# Patient Record
Sex: Male | Born: 1985 | Race: White | Hispanic: No | Marital: Married | Smoking: Current some day smoker
Health system: Southern US, Community
[De-identification: ages and names within clinical notes are randomized; demographics above are authoritative.]

## PROBLEM LIST (undated history)

## (undated) ENCOUNTER — Emergency Department (HOSPITAL_COMMUNITY): Admission: EM | Payer: Self-pay | Source: Home / Self Care

## (undated) DIAGNOSIS — J45909 Unspecified asthma, uncomplicated: Secondary | ICD-10-CM

## (undated) DIAGNOSIS — K219 Gastro-esophageal reflux disease without esophagitis: Secondary | ICD-10-CM

## (undated) HISTORY — PX: HERNIA REPAIR: SHX51

---

## 2019-05-04 ENCOUNTER — Encounter: Payer: Self-pay | Admitting: Emergency Medicine

## 2019-05-04 ENCOUNTER — Ambulatory Visit
Admission: EM | Admit: 2019-05-04 | Discharge: 2019-05-04 | Disposition: A | Discharge status: Home / Self Care | Payer: Self-pay

## 2019-05-04 DIAGNOSIS — J0100 Acute maxillary sinusitis, unspecified: Secondary | ICD-10-CM

## 2019-05-04 HISTORY — DX: Unspecified asthma, uncomplicated: J45.909

## 2019-05-04 HISTORY — DX: Gastro-esophageal reflux disease without esophagitis: K21.9

## 2019-05-04 MED ORDER — AMOXICILLIN-POT CLAVULANATE 875-125 MG PO TABS: 1.0000 | ORAL_TABLET | Freq: Two times a day (BID) | ORAL | 0 refills | Status: AC

## 2019-05-04 NOTE — ED Triage Notes (Signed)
Pt presents with c/o sinus congestion and sinus pressure for past 2 weeks

## 2019-05-04 NOTE — ED Provider Notes (Signed)
[facility]    CSN: [id number] Arrival date & time: 05/04/19  1757      History   Chief Complaint Chief Complaint  Patient presents with  . Nasal Congestion    HPI Eddie Hart is a 34 y.o. male.   Who presented to the urgent care with a complaint of worsening sinus congestion, and sinus pressure for the past 2 weeks.  Report yellow-greenish mucus.  Denies sick exposure to COVID, flu or strep.  Denies recent travel.  Denies aggravating or alleviating symptoms.  Has tried Sudafed, Zyrtec, and Flonase without relief.  Denies previous COVID infection.   Denies fever, chills, fatigue, nasal congestion, rhinorrhea, sore throat, cough, SOB, wheezing, chest pain, nausea, vomiting, changes in bowel or bladder habits.        Past Medical History:  Diagnosis Date  . Asthma   . GERD (gastroesophageal reflux disease)     There are no problems to display for this patient.   Past Surgical History:  Procedure Laterality Date  . HERNIA REPAIR         Home Medications    Prior to Admission medications   Medication Sig Start Date End Date Taking? Authorizing Provider  omeprazole (PRILOSEC) 20 MG capsule Take 20 mg by mouth in the morning and at bedtime.   Yes [provider]  amoxicillin-clavulanate (AUGMENTIN) 875-125 MG tablet Take 1 tablet by mouth every 12 (twelve) hours. 05/04/19   Gregor Dershem, Zachery Dakins, FNP    Family History History reviewed. No pertinent family history.  Social History Social History   Tobacco Use  . Smoking status: Current Some Day Smoker    Types: Cigarettes  . Smokeless tobacco: Never Used  Substance Use Topics  . Alcohol use: Yes  . Drug use: Not Currently     Allergies   Patient has no known allergies.   Review of Systems Review of Systems  Constitutional: Negative.   HENT: Positive for sinus pressure and sinus pain.   Respiratory: Negative.   Cardiovascular: Negative.   All other systems reviewed and are  negative.    Physical Exam Triage Vital Signs ED Triage Vitals  Enc Vitals Group     BP      Pulse      Resp      Temp      Temp src      SpO2      Weight      Height      Head Circumference      Peak Flow      Pain Score      Pain Loc      Pain Edu?      Excl. in GC?    No data found.  Updated Vital Signs BP (!) 141/84   Pulse 81   Temp 98.4 F (36.9 C)   Resp 18   SpO2 98%   Visual Acuity Right Eye Distance:   Left Eye Distance:   Bilateral Distance:    Right Eye Near:   Left Eye Near:    Bilateral Near:     Physical Exam Vitals and nursing note reviewed.  Constitutional:      General: He is not in acute distress.    Appearance: Normal appearance. He is normal weight. He is not ill-appearing or toxic-appearing.  HENT:     Head: Normocephalic.     Right Ear: Tympanic membrane, ear canal and external ear normal. There is no impacted cerumen.     Left  Ear: Tympanic membrane, ear canal and external ear normal. There is no impacted cerumen.     Nose: No congestion or rhinorrhea.     Right Sinus: Maxillary sinus tenderness present.     Left Sinus: Maxillary sinus tenderness present.     Mouth/Throat:     Mouth: Mucous membranes are moist.     Pharynx: Oropharynx is clear. No oropharyngeal exudate or posterior oropharyngeal erythema.  Cardiovascular:     Rate and Rhythm: Normal rate and regular rhythm.     Pulses: Normal pulses.     Heart sounds: Normal heart sounds. No murmur.  Pulmonary:     Effort: Pulmonary effort is normal. No respiratory distress.     Breath sounds: Normal breath sounds. No wheezing or rhonchi.  Chest:     Chest wall: No tenderness.  Neurological:     Mental Status: He is alert and oriented to person, place, and time.      UC Treatments / Results  Labs (all labs ordered are listed, but only abnormal results are displayed) Labs Reviewed - No data to display  EKG   Radiology No results found.  Procedures Procedures  (including critical care time)  Medications Ordered in UC Medications - No data to display  Initial Impression / Assessment and Plan / UC Course  I have reviewed the triage vital signs and the nursing notes.  Pertinent labs & imaging results that were available during my care of the patient were reviewed by me and considered in my medical decision making (see chart for details).     Patient is stable at discharge. Symptoms consistent with acute sinusitis Augmentin was prescribed Patient was advised to continue to take Flonase and Zyrtec as prescribed Return  for worsening of symptoms   Final Clinical Impressions(s) / UC Diagnoses   Final diagnoses:  Acute maxillary sinusitis, recurrence not specified     Discharge Instructions     Rest and push fluids Continue to take Zyrtec as prescribed  Continue to take Flonase as prescribed Augmentin prescribed.  Take as directed and to completion Continue with OTC ibuprofen/tylenol as needed for pain Follow up with PCP or [facility] if symptoms persists Return or go to the ED if you have any new or worsening symptoms such as fever, chills, worsening sinus pain/pressure, cough, sore throat, chest pain, shortness of breath, abdominal pain, changes in bowel or bladder habits, etc...     ED Prescriptions    Medication Sig Dispense Auth. Provider   amoxicillin-clavulanate (AUGMENTIN) 875-125 MG tablet Take 1 tablet by mouth every 12 (twelve) hours. 14 tablet Brayton Baumgartner, Darrelyn Hillock, FNP     PDMP not reviewed this encounter.   Emerson Monte, FNP 05/04/19 1826

## 2019-05-04 NOTE — Discharge Instructions (Addendum)
Rest and push fluids Continue to take Zyrtec as prescribed  Continue to take Flonase as prescribed Augmentin prescribed.  Take as directed and to completion Continue with OTC ibuprofen/tylenol as needed for pain Follow up with PCP or [facility] if symptoms persists Return or go to the ED if you have any new or worsening symptoms such as fever, chills, worsening sinus pain/pressure, cough, sore throat, chest pain, shortness of breath, abdominal pain, changes in bowel or bladder habits, etc..Marland Kitchen

## 2019-05-07 ENCOUNTER — Emergency Department (HOSPITAL_COMMUNITY)
Admission: EM | Admit: 2019-05-07 | Discharge: 2019-05-07 | Disposition: A | Discharge status: Home / Self Care | Payer: Self-pay | Attending: Emergency Medicine | Admitting: Emergency Medicine

## 2019-05-07 ENCOUNTER — Encounter (HOSPITAL_COMMUNITY): Payer: Self-pay | Admitting: Emergency Medicine

## 2019-05-07 ENCOUNTER — Other Ambulatory Visit: Payer: Self-pay

## 2019-05-07 DIAGNOSIS — J45909 Unspecified asthma, uncomplicated: Secondary | ICD-10-CM | POA: Insufficient documentation

## 2019-05-07 DIAGNOSIS — J0100 Acute maxillary sinusitis, unspecified: Secondary | ICD-10-CM | POA: Insufficient documentation

## 2019-05-07 DIAGNOSIS — F17210 Nicotine dependence, cigarettes, uncomplicated: Secondary | ICD-10-CM | POA: Insufficient documentation

## 2019-05-07 MED ORDER — NAPROXEN 500 MG PO TABS: 500.0000 mg | ORAL_TABLET | Freq: Two times a day (BID) | ORAL | 0 refills | Status: AC

## 2019-05-07 MED ORDER — PREDNISONE 10 MG PO TABS: ORAL_TABLET | ORAL | 0 refills | Status: AC

## 2019-05-07 NOTE — ED Provider Notes (Signed)
[facility] EMERGENCY DEPARTMENT Provider Note   CSN: [id number] Arrival date & time: 05/07/19  1340     History Chief Complaint  Patient presents with  . Facial Pain    Eddie Hart is a 34 y.o. male.  HPI      Eddie Hart is a 33 y.o. male who presents to the Emergency Department complaining of  Right sided facial pain and sinus pressure for greater than two weeks.  He was trying to manage his symptoms at home using a Nettie pot, Flonase nasal spray and Sudafed.  He states his facial pain and pressure has persisted.  He describes a throbbing pain from his right cheek that radiates behind his eye and into his right temple.  Pain temporarily improves with pressure along his right cheek.  He also endorses some nasal congestion.  He was seen 3 days ago at a local urgent care and prescribed Augmentin.  He comes here due to no improvement with his current treatment.  He denies any worsening symptoms, headache, facial weakness or numbness, dizziness, fever or chills.  He also complains of some pain along his right jaw, but states this is minimal.  He denies any dental pain or caries.  No neck pain or stiffness.    Past Medical History:  Diagnosis Date  . Asthma   . GERD (gastroesophageal reflux disease)     There are no problems to display for this patient.   Past Surgical History:  Procedure Laterality Date  . HERNIA REPAIR       No family history on file.  Social History   Tobacco Use  . Smoking status: Current Some Day Smoker    Types: Cigarettes  . Smokeless tobacco: Never Used  Substance Use Topics  . Alcohol use: Yes  . Drug use: Not Currently    Home Medications Prior to Admission medications   Medication Sig Start Date End Date Taking? Authorizing Provider  acetaminophen (TYLENOL) 500 MG tablet Take 1,500 mg by mouth every 6 (six) hours as needed for mild pain, fever or headache.   Yes [provider]  albuterol (VENTOLIN HFA) 108 (90 Base)  MCG/ACT inhaler Inhale 1-2 puffs into the lungs every 6 (six) hours as needed for wheezing or shortness of breath.   Yes [provider]  amoxicillin-clavulanate (AUGMENTIN) 875-125 MG tablet Take 1 tablet by mouth every 12 (twelve) hours. 05/04/19  Yes Avegno, Zachery Dakins, FNP  cetirizine (ZYRTEC) 10 MG tablet Take 10 mg by mouth daily.   Yes [provider]  cholecalciferol (VITAMIN D3) 25 MCG (1000 UNIT) tablet Take 5,000 Units by mouth daily.   Yes [provider]  fluticasone (FLONASE) 50 MCG/ACT nasal spray Place 2 sprays into both nostrils in the morning and at bedtime.   Yes [provider]  naproxen sodium (ALEVE) 220 MG tablet Take 440 mg by mouth daily as needed.   Yes [provider]  omeprazole (PRILOSEC) 20 MG capsule Take 20 mg by mouth in the morning and at bedtime.   Yes [provider]  pseudoephedrine (SUDAFED) 120 MG 12 hr tablet Take 120 mg by mouth every 12 (twelve) hours as needed for congestion.   Yes [provider]    Allergies    Patient has no known allergies.  Review of Systems   Review of Systems  Constitutional: Negative for activity change, appetite change, chills and fever.  HENT: Positive for congestion, sinus pressure and sinus pain. Negative for facial swelling, nosebleeds, rhinorrhea,  sore throat and trouble swallowing.   Eyes: Negative for pain and visual disturbance.  Respiratory: Negative for cough, shortness of breath and wheezing.   Gastrointestinal: Negative for nausea and vomiting.  Musculoskeletal: Negative for neck pain and neck stiffness.  Skin: Negative for rash.  Neurological: Negative for dizziness, weakness, numbness and headaches.  Hematological: Negative for adenopathy.  Psychiatric/Behavioral: Negative for confusion.    Physical Exam Updated Vital Signs BP (!) 155/91 (BP Location: Right Arm)   Pulse 84   Temp 98.2 F (36.8 C) (Oral)   Resp 16   Ht 5\' 10"  (1.778 m)   Wt  98 kg   SpO2 100%   BMI 30.99 kg/m   Physical Exam Vitals and nursing note reviewed.  Constitutional:      General: He is not in acute distress.    Appearance: Normal appearance. He is not ill-appearing or toxic-appearing.  HENT:     Right Ear: Tympanic membrane and ear canal normal.     Left Ear: Tympanic membrane and ear canal normal.     Nose: Mucosal edema present.     Right Sinus: Maxillary sinus tenderness and frontal sinus tenderness present.     Left Sinus: No maxillary sinus tenderness or frontal sinus tenderness.     Mouth/Throat:     Mouth: Mucous membranes are moist. No oral lesions.     Dentition: No dental tenderness or dental caries.     Pharynx: Oropharynx is clear. Uvula midline. No uvula swelling.  Eyes:     Extraocular Movements: Extraocular movements intact.     Conjunctiva/sclera: Conjunctivae normal.  Cardiovascular:     Rate and Rhythm: Normal rate and regular rhythm.     Pulses: Normal pulses.  Pulmonary:     Effort: Pulmonary effort is normal. No respiratory distress.     Breath sounds: No wheezing.  Abdominal:     General: There is no distension.     Palpations: Abdomen is soft.     Tenderness: There is no abdominal tenderness.  Musculoskeletal:        General: Normal range of motion.     Right lower leg: No edema.     Left lower leg: No edema.  Skin:    General: Skin is warm.     Capillary Refill: Capillary refill takes less than 2 seconds.     Findings: No erythema or rash.  Neurological:     General: No focal deficit present.     Mental Status: He is alert.     Sensory: Sensation is intact. No sensory deficit.     Motor: No weakness.     Comments: CN II-XII grossly intact.  No facial droop or dysarthria     ED Results / Procedures / Treatments   Labs (all labs ordered are listed, but only abnormal results are displayed) Labs Reviewed - No data to display  EKG None  Radiology No results found.  Procedures Procedures (including  critical care time)  Medications Ordered in ED Medications - No data to display  ED Course  I have reviewed the triage vital signs and the nursing notes.  Pertinent labs & imaging results that were available during my care of the patient were reviewed by me and considered in my medical decision making (see chart for details).    MDM Rules/Calculators/A&P                      Patient with 2-week history of nasal congestion, sinus pressure  and pain.  He was seen earlier this week at urgent care and prescribed Augmentin.  No focal neurological deficits, no dental injury or tenderness.  Patient well-appearing.  Symptoms are likely related to persistent sinusitis.  Will prescribe steroids and he agrees to outpatient follow-up if needed.  Return precautions were discussed.   Final Clinical Impression(s) / ED Diagnoses Final diagnoses:  Acute maxillary sinusitis, recurrence not specified    Rx / DC Orders ED Discharge Orders    None       Pauline Aus, PA-C 05/07/19 1521    Bethann Berkshire, MD 05/10/19 1034

## 2019-05-07 NOTE — ED Triage Notes (Signed)
Patient complains of sinus congestion for 2.5 weeks. Was seen at [facility] 3 days ago and given Augmentin. Patient states that he feels like its getting worse and not getting better.

## 2019-05-07 NOTE — Discharge Instructions (Addendum)
Continue taking your Augmentin as directed until its finished.  Also continue taking your Sudafed and using your Flonase nasal spray.  Start the prednisone prescription this evening or tomorrow.  Return to the emergency department for any worsening symptoms

## 2019-05-11 ENCOUNTER — Encounter (HOSPITAL_COMMUNITY): Payer: Self-pay | Admitting: Emergency Medicine

## 2019-05-11 ENCOUNTER — Emergency Department (HOSPITAL_COMMUNITY)
Admission: EM | Admit: 2019-05-11 | Discharge: 2019-05-11 | Disposition: A | Discharge status: Home / Self Care | Payer: Self-pay | Attending: Emergency Medicine | Admitting: Emergency Medicine

## 2019-05-11 ENCOUNTER — Other Ambulatory Visit: Payer: Self-pay

## 2019-05-11 DIAGNOSIS — J320 Chronic maxillary sinusitis: Secondary | ICD-10-CM | POA: Insufficient documentation

## 2019-05-11 DIAGNOSIS — J45909 Unspecified asthma, uncomplicated: Secondary | ICD-10-CM | POA: Insufficient documentation

## 2019-05-11 DIAGNOSIS — Z79899 Other long term (current) drug therapy: Secondary | ICD-10-CM | POA: Insufficient documentation

## 2019-05-11 DIAGNOSIS — F17210 Nicotine dependence, cigarettes, uncomplicated: Secondary | ICD-10-CM | POA: Insufficient documentation

## 2019-05-11 MED ORDER — DOXYCYCLINE HYCLATE 100 MG PO TABS
100.0000 mg | ORAL_TABLET | Freq: Once | ORAL | Status: AC
  Administered 2019-05-11: 100 mg via ORAL
  Filled 2019-05-11: qty 1

## 2019-05-11 MED ORDER — HYDROCODONE-ACETAMINOPHEN 5-325 MG PO TABS: 1.0000 | ORAL_TABLET | ORAL | 0 refills | Status: AC | PRN

## 2019-05-11 MED ORDER — DOXYCYCLINE HYCLATE 100 MG PO TABS: 100.0000 mg | ORAL_TABLET | Freq: Two times a day (BID) | ORAL | 0 refills | Status: DC

## 2019-05-11 NOTE — ED Triage Notes (Signed)
Patient was seen here over the weekend and prescribed antibiotics for a URI. Patient feels like the pain and pressure under his right eye up into his head is getting worse.

## 2019-05-11 NOTE — Discharge Instructions (Signed)
Return if any problems.

## 2019-05-12 MED FILL — Hydrocodone-Acetaminophen Tab 5-325 MG: ORAL | Qty: 6 | Status: AC

## 2019-05-12 NOTE — ED Provider Notes (Signed)
[facility] EMERGENCY DEPARTMENT Provider Note   CSN: [id number] Arrival date & time: 05/11/19  1901     History Chief Complaint  Patient presents with  . sinus pressure    Eddie Hart is a 34 y.o. male.  The history is provided by the patient. No language interpreter was used.  URI Presenting symptoms: congestion, cough and facial pain   Severity:  Moderate Onset quality:  Gradual Timing:  Constant Progression:  Worsening Chronicity:  New Relieved by:  Nothing Worsened by:  Nothing Ineffective treatments:  None tried Associated symptoms: sinus pain   Risk factors: recent illness    Pt has finished a course of Augmentin and a course of prednisone.  Pt has continued right face pain and swelling     Past Medical History:  Diagnosis Date  . Asthma   . GERD (gastroesophageal reflux disease)     There are no problems to display for this patient.   Past Surgical History:  Procedure Laterality Date  . HERNIA REPAIR         No family history on file.  Social History   Tobacco Use  . Smoking status: Current Some Day Smoker    Types: Cigarettes  . Smokeless tobacco: Never Used  Substance Use Topics  . Alcohol use: Yes  . Drug use: Not Currently    Home Medications Prior to Admission medications   Medication Sig Start Date End Date Taking? Authorizing Provider  acetaminophen (TYLENOL) 500 MG tablet Take 1,500 mg by mouth every 6 (six) hours as needed for mild pain, fever or headache.    [provider]  albuterol (VENTOLIN HFA) 108 (90 Base) MCG/ACT inhaler Inhale 1-2 puffs into the lungs every 6 (six) hours as needed for wheezing or shortness of breath.    [provider]  amoxicillin-clavulanate (AUGMENTIN) 875-125 MG tablet Take 1 tablet by mouth every 12 (twelve) hours. 05/04/19   Avegno, Zachery Dakins, FNP  cetirizine (ZYRTEC) 10 MG tablet Take 10 mg by mouth daily.    [provider]  cholecalciferol (VITAMIN D3) 25 MCG (1000  UNIT) tablet Take 5,000 Units by mouth daily.    [provider]  doxycycline (VIBRA-TABS) 100 MG tablet Take 1 tablet (100 mg total) by mouth 2 (two) times daily. 05/11/19   Elson Areas, PA-C  fluticasone (FLONASE) 50 MCG/ACT nasal spray Place 2 sprays into both nostrils in the morning and at bedtime.    [provider]  HYDROcodone-acetaminophen (NORCO) 5-325 MG tablet Take 1 tablet by mouth every 4 (four) hours as needed for moderate pain. 05/11/19   Elson Areas, PA-C  HYDROcodone-acetaminophen (NORCO/VICODIN) 5-325 MG tablet Take 1 tablet by mouth every 4 (four) hours as needed. 05/11/19   Elson Areas, PA-C  naproxen (NAPROSYN) 500 MG tablet Take 1 tablet (500 mg total) by mouth 2 (two) times daily with a meal. 05/07/19   Triplett, Tammy, PA-C  naproxen sodium (ALEVE) 220 MG tablet Take 440 mg by mouth daily as needed.    [provider]  omeprazole (PRILOSEC) 20 MG capsule Take 20 mg by mouth in the morning and at bedtime.    [provider]  predniSONE (DELTASONE) 10 MG tablet Take 6 tablets day one, 5 tablets day two, 4 tablets day three, 3 tablets day four, 2 tablets day five, then 1 tablet day six 05/07/19   Triplett, Tammy, PA-C  pseudoephedrine (SUDAFED) 120 MG 12 hr tablet Take 120 mg by mouth every 12 (twelve) hours  as needed for congestion.    [provider]    Allergies    Patient has no known allergies.  Review of Systems   Review of Systems  HENT: Positive for congestion and sinus pain.   Respiratory: Positive for cough.   All other systems reviewed and are negative.   Physical Exam Updated Vital Signs BP (!) 141/99 (BP Location: Right Arm)   Pulse 72   Temp 98.8 F (37.1 C) (Oral)   Resp 18   Ht 5\' 10"  (1.778 m)   Wt 98 kg   SpO2 98%   BMI 30.99 kg/m   Physical Exam Vitals and nursing note reviewed.  Constitutional:      Appearance: He is well-developed.  HENT:     Head: Normocephalic and atraumatic.      Comments: Tender maxillary sinus on right    Right Ear: Tympanic membrane normal.     Left Ear: Tympanic membrane normal.     Nose: Nose normal.     Mouth/Throat:     Mouth: Mucous membranes are moist.  Eyes:     Conjunctiva/sclera: Conjunctivae normal.  Cardiovascular:     Rate and Rhythm: Normal rate and regular rhythm.     Heart sounds: No murmur.  Pulmonary:     Effort: Pulmonary effort is normal. No respiratory distress.     Breath sounds: Normal breath sounds.  Abdominal:     General: Abdomen is flat.     Palpations: Abdomen is soft.     Tenderness: There is no abdominal tenderness.  Musculoskeletal:        General: Normal range of motion.     Cervical back: Normal range of motion and neck supple.  Skin:    General: Skin is warm and dry.  Neurological:     General: No focal deficit present.     Mental Status: He is alert.     ED Results / Procedures / Treatments   Labs (all labs ordered are listed, but only abnormal results are displayed) Labs Reviewed - No data to display  EKG None  Radiology No results found.  Procedures Procedures (including critical care time)  Medications Ordered in ED Medications  doxycycline (VIBRA-TABS) tablet 100 mg (100 mg Oral Given 05/11/19 2105)    ED Course  I have reviewed the triage vital signs and the nursing notes.  Pertinent labs & imaging results that were available during my care of the patient were reviewed by me and considered in my medical decision making (see chart for details).    MDM Rules/Calculators/A&P                      MDM  Pt given 1st dosage of doxycycline here and prepack of hydrocodone  Final Clinical Impression(s) / ED Diagnoses Final diagnoses:  Maxillary sinusitis, unspecified chronicity    Rx / DC Orders ED Discharge Orders         Ordered    doxycycline (VIBRA-TABS) 100 MG tablet  2 times daily     05/11/19 2021    HYDROcodone-acetaminophen (NORCO/VICODIN) 5-325 MG tablet  Every 4  hours PRN     05/11/19 2021    HYDROcodone-acetaminophen (NORCO) 5-325 MG tablet  Every 4 hours PRN     05/11/19 2055           Sidney Ace 05/12/19 1702    Milton Ferguson, MD 05/16/19 [phone]

## 2019-05-13 ENCOUNTER — Emergency Department (HOSPITAL_COMMUNITY)
Admission: EM | Admit: 2019-05-13 | Discharge: 2019-05-13 | Disposition: A | Discharge status: Home / Self Care | Payer: HRSA Program | Attending: Emergency Medicine | Admitting: Emergency Medicine

## 2019-05-13 ENCOUNTER — Encounter (HOSPITAL_COMMUNITY): Payer: Self-pay | Admitting: Emergency Medicine

## 2019-05-13 ENCOUNTER — Other Ambulatory Visit: Payer: Self-pay

## 2019-05-13 ENCOUNTER — Emergency Department (HOSPITAL_COMMUNITY): Payer: HRSA Program

## 2019-05-13 DIAGNOSIS — R519 Headache, unspecified: Secondary | ICD-10-CM | POA: Diagnosis present

## 2019-05-13 DIAGNOSIS — J0100 Acute maxillary sinusitis, unspecified: Secondary | ICD-10-CM | POA: Insufficient documentation

## 2019-05-13 DIAGNOSIS — U071 COVID-19: Secondary | ICD-10-CM | POA: Diagnosis not present

## 2019-05-13 DIAGNOSIS — Z79899 Other long term (current) drug therapy: Secondary | ICD-10-CM | POA: Diagnosis not present

## 2019-05-13 DIAGNOSIS — F17210 Nicotine dependence, cigarettes, uncomplicated: Secondary | ICD-10-CM | POA: Diagnosis not present

## 2019-05-13 DIAGNOSIS — J45909 Unspecified asthma, uncomplicated: Secondary | ICD-10-CM | POA: Insufficient documentation

## 2019-05-13 LAB — BASIC METABOLIC PANEL
Anion gap: 9 (ref 5–15)
BUN: 14 mg/dL (ref 6–20)
CO2: 26 mmol/L (ref 22–32)
Calcium: 9.3 mg/dL (ref 8.9–10.3)
Chloride: 102 mmol/L (ref 98–111)
Creatinine, Ser: 0.95 mg/dL (ref 0.61–1.24)
GFR calc Af Amer: >60 mL/min (ref >60)
GFR calc non Af Amer: >60 mL/min (ref >60)
Glucose, Bld: 145 mg/dL — ABNORMAL HIGH (ref 70–99)
Potassium: 3.4 mmol/L — ABNORMAL LOW (ref 3.5–5.1)
Sodium: 137 mmol/L (ref 135–145)

## 2019-05-13 LAB — CBC WITH DIFFERENTIAL/PLATELET
Abs Immature Granulocytes: 0.03 10*3/uL (ref 0.00–0.07)
Basophils Absolute: 0.1 10*3/uL (ref 0.0–0.1)
Basophils Relative: 1 %
Eosinophils Absolute: 0.2 10*3/uL (ref 0.0–0.5)
Eosinophils Relative: 2 %
HCT: 40.3 % (ref 39.0–52.0)
Hemoglobin: 13.8 g/dL (ref 13.0–17.0)
Immature Granulocytes: 0 %
Lymphocytes Relative: 12 %
Lymphs Abs: 1.3 10*3/uL (ref 0.7–4.0)
MCH: 31.7 pg (ref 26.0–34.0)
MCHC: 34.2 g/dL (ref 30.0–36.0)
MCV: 92.6 fL (ref 80.0–100.0)
Monocytes Absolute: 0.6 10*3/uL (ref 0.1–1.0)
Monocytes Relative: 6 %
Neutro Abs: 8.2 10*3/uL — ABNORMAL HIGH (ref 1.7–7.7)
Neutrophils Relative %: 79 %
Platelets: 237 10*3/uL (ref 150–400)
RBC: 4.35 MIL/uL (ref 4.22–5.81)
RDW: 12.1 % (ref 11.5–15.5)
WBC: 10.3 10*3/uL (ref 4.0–10.5)
nRBC: 0 % (ref 0.0–0.2)

## 2019-05-13 LAB — RESPIRATORY PANEL BY RT PCR (FLU A&B, COVID)
Influenza A by PCR: NEGATIVE
Influenza B by PCR: NEGATIVE
SARS Coronavirus 2 by RT PCR: POSITIVE — AB

## 2019-05-13 MED ORDER — IOHEXOL 300 MG/ML  SOLN
75.0000 mL | Freq: Once | INTRAMUSCULAR | Status: AC | PRN
  Administered 2019-05-13: 75 mL via INTRAVENOUS

## 2019-05-13 MED ORDER — CLINDAMYCIN HCL 150 MG PO CAPS: 300.0000 mg | ORAL_CAPSULE | Freq: Three times a day (TID) | ORAL | 0 refills | Status: AC

## 2019-05-13 NOTE — ED Provider Notes (Signed)
[facility] EMERGENCY DEPARTMENT Provider Note   CSN: [id number] Arrival date & time: 05/13/19  1708     History Chief Complaint  Patient presents with  . Facial Pain    right    Eddie Hart is a 34 y.o. male who presents for evaluation of persistent right-sided facial pain that has been ongoing for last 4 weeks.  He reports initially, he had some sinus pressure and saw urgent care was prescribed Augmentin.  He came into the ED a few days later for continued pain.  He was given prednisone.  He additionally was seen again on 05/11/19 for evaluation of persistent symptoms after finishing his Augmentin.  He was started on doxycycline which he states he has been compliant with.  He comes back in today for continued pain.  He feels like it is progressively getting worse.  He states that it is in the right face and goes up to the right temple region and feels like he has pain behind his right eye.  He states that he has trouble keeping his eye open secondary to pain.  He has not had any vision changes.  He has not noticed any drainage from the eye.  He does report yesterday, he noticed some purulent drainage coming from his right nare but states that has been a 1 episode it is not occurred again.  He also feels like he has a tingling sensation in his right face but denies any numbness.  He has not had any dental pain but he does feel like it extends onto his right jaw.  He feels like his face has been swollen.  He has not noted any fevers, difficulty swallowing.  The history is provided by the patient.       Past Medical History:  Diagnosis Date  . Asthma   . GERD (gastroesophageal reflux disease)     There are no problems to display for this patient.   Past Surgical History:  Procedure Laterality Date  . HERNIA REPAIR         History reviewed. No pertinent family history.  Social History   Tobacco Use  . Smoking status: Current Some Day Smoker    Types: Cigarettes  .  Smokeless tobacco: Never Used  Substance Use Topics  . Alcohol use: Yes  . Drug use: Not Currently    Home Medications Prior to Admission medications   Medication Sig Start Date End Date Taking? Authorizing Provider  acetaminophen (TYLENOL) 500 MG tablet Take 1,500 mg by mouth every 6 (six) hours as needed for mild pain, fever or headache.    [provider]  albuterol (VENTOLIN HFA) 108 (90 Base) MCG/ACT inhaler Inhale 1-2 puffs into the lungs every 6 (six) hours as needed for wheezing or shortness of breath.    [provider]  amoxicillin-clavulanate (AUGMENTIN) 875-125 MG tablet Take 1 tablet by mouth every 12 (twelve) hours. 05/04/19   Avegno, Zachery Dakins, FNP  cetirizine (ZYRTEC) 10 MG tablet Take 10 mg by mouth daily.    [provider]  cholecalciferol (VITAMIN D3) 25 MCG (1000 UNIT) tablet Take 5,000 Units by mouth daily.    [provider]  doxycycline (VIBRA-TABS) 100 MG tablet Take 1 tablet (100 mg total) by mouth 2 (two) times daily. 05/11/19   Elson Areas, PA-C  fluticasone (FLONASE) 50 MCG/ACT nasal spray Place 2 sprays into both nostrils in the morning and at bedtime.    [provider]  HYDROcodone-acetaminophen (NORCO) 5-325 MG tablet  Take 1 tablet by mouth every 4 (four) hours as needed for moderate pain. 05/11/19   Fransico Meadow, PA-C  HYDROcodone-acetaminophen (NORCO/VICODIN) 5-325 MG tablet Take 1 tablet by mouth every 4 (four) hours as needed. 05/11/19   Fransico Meadow, PA-C  naproxen (NAPROSYN) 500 MG tablet Take 1 tablet (500 mg total) by mouth 2 (two) times daily with a meal. 05/07/19   Triplett, Tammy, PA-C  naproxen sodium (ALEVE) 220 MG tablet Take 440 mg by mouth daily as needed.    [provider]  omeprazole (PRILOSEC) 20 MG capsule Take 20 mg by mouth in the morning and at bedtime.    [provider]  predniSONE (DELTASONE) 10 MG tablet Take 6 tablets day one, 5 tablets day two, 4 tablets day three,  3 tablets day four, 2 tablets day five, then 1 tablet day six 05/07/19   Triplett, Tammy, PA-C  pseudoephedrine (SUDAFED) 120 MG 12 hr tablet Take 120 mg by mouth every 12 (twelve) hours as needed for congestion.    [provider]    Allergies    Patient has no known allergies.  Review of Systems   Review of Systems  Constitutional: Negative for fever.  HENT: Positive for congestion, facial swelling, sinus pressure and sinus pain. Negative for trouble swallowing.   Eyes: Negative for visual disturbance.  Neurological: Positive for numbness (tingling).  All other systems reviewed and are negative.   Physical Exam Updated Vital Signs BP 122/78 (BP Location: Right Arm)   Pulse 66   Temp 98.2 F (36.8 C) (Oral)   Resp 16   Ht 5\' 10"  (1.778 m)   Wt 98 kg   SpO2 100%   BMI 30.99 kg/m   Physical Exam Vitals and nursing note reviewed.  Constitutional:      Appearance: He is well-developed.  HENT:     Head: Normocephalic and atraumatic.      Comments: He has a small amount of soft tissue swelling noted to the inferior periorbital region but otherwise face is symmetric in appearance without any overlying warmth, erythema, edema.    Nose:     Right Sinus: Maxillary sinus tenderness present. No frontal sinus tenderness.     Left Sinus: No maxillary sinus tenderness or frontal sinus tenderness.     Comments: No abscess identified with evaluation of bilateral nares.  He does have right maxillary tenderness.  No overlying warmth, erythema, edema.    Mouth/Throat:     Comments: Posterior oropharynx is clear without any erythema, edema, exudates.  No identifiable dental abscess.  Uvula is midline.  Airways patent, phonation is intact. Eyes:     General: No scleral icterus.       Right eye: No discharge.        Left eye: No discharge.     Conjunctiva/sclera: Conjunctivae normal.     Comments: Very small amount of soft tissue swelling noted to the inferior periorbital region of  the right eye.  No overlying warmth, erythema, induration. PERRL. EOMs intact. No nystagmus. No neglect.   Pulmonary:     Effort: Pulmonary effort is normal.  Skin:    General: Skin is warm and dry.  Neurological:     Mental Status: He is alert.     Comments: Cranial nerves III-XII intact Follows commands, Moves all extremities  Normal strength.  He reports a "tingling type sensation" to the right side of his face but does have full sensation when I compared to the left side.  Sensation intact throughout all major nerve distributions No gait abnormalities  No slurred speech. No facial droop.   Psychiatric:        Speech: Speech normal.        Behavior: Behavior normal.     ED Results / Procedures / Treatments   Labs (all labs ordered are listed, but only abnormal results are displayed) Labs Reviewed  CBC WITH DIFFERENTIAL/PLATELET - Abnormal; Notable for the following components:      Result Value   Neutro Abs 8.2 (*)    All other components within normal limits  BASIC METABOLIC PANEL - Abnormal; Notable for the following components:   Potassium 3.4 (*)    Glucose, Bld 145 (*)    All other components within normal limits  RESPIRATORY PANEL BY RT PCR (FLU A&B, COVID)    EKG None  Radiology CT Maxillofacial W Contrast  Result Date: 05/13/2019 CLINICAL DATA:  34 year old male with sinus infection for 1 month with right facial pain, temporal pain. EXAM: CT MAXILLOFACIAL WITH CONTRAST TECHNIQUE: Multidetector CT imaging of the maxillofacial structures was performed with intravenous contrast. Multiplanar CT image reconstructions were also generated. CONTRAST:  32mL OMNIPAQUE IOHEXOL 300 MG/ML  SOLN COMPARISON:  None. FINDINGS: Osseous: No acute dental finding. Mandible intact. There is heterogeneous sclerosis at the left mandible condyle (series 7, image 71) although no other left TMJ degeneration identified. No acute facial fracture identified. Central skull base and visible  cervical vertebrae appear intact. Orbits: Intact orbital walls. Globes and bilateral orbits soft tissues remain within normal limits. Sinuses: Abnormal right maxillary sinus with fluid level, additional non dependent mucosal thickening or loculated fluid, and a mildly dehiscent appearance of the posterior right maxillary wall (series 3, image 38) associated with abnormal retro maxillary inflammation/edema (series 2, image 35). The remainder of the right masticator space appears spared. The right pterygoid palatine fossa remains within normal limits. No soft tissue gas. No drainable fluid outside of the sinus. No other suspicious osseous changes identified about the right maxillary sinus. Minimal paranasal sinus mucosal thickening otherwise. The tympanic cavities and mastoids are clear. There is symmetric nasal cavity mucosal thickening. Left middle concha bullosa. Olfactory recesses remain pneumatized. Soft tissues: Negative visible larynx. Motion artifact at the oropharynx. Negative parapharyngeal, retropharyngeal, sublingual, submandibular, and parotid spaces. No upper cervical lymphadenopathy. The major vascular structures in the neck and at the skull base including the right IJ remain patent. Limited intracranial: Cavernous sinus appears to be patent and within normal limits. Negative visible brain parenchyma. IMPRESSION: 1. Complicated Right Maxillary Sinusitis, with spread of infection or edema into the retro-maxillary space as seen on series 2, image 35. Recommend ENT consultation. Associated mildly dehiscent appearance of the posterior wall of that sinus, but no other complicating features. 2. The remaining paranasal sinuses are well pneumatized. There is symmetric nasal cavity mucosal thickening raising the possibility of rhinitis. 3. Negative face CT elsewhere. Electronically Signed   By: Odessa Fleming M.D.   On: 05/13/2019 19:14    Procedures Procedures (including critical care time)  Medications Ordered  in ED Medications  iohexol (OMNIPAQUE) 300 MG/ML solution 75 mL (75 mLs Intravenous Contrast Given 05/13/19 1856)    ED Course  I have reviewed the triage vital signs and the nursing notes.  Pertinent labs & imaging results that were available during my care of the patient were reviewed by me and considered in my medical decision making (see chart for details).    MDM Rules/Calculators/A&P  34 year old male who presents today for continued and persistent right-sided facial pain.  Has had 3 prior visits for sinus congestion, facial pain and has been on Augmentin and doxycycline with no improvement.  He was additionally given pain medication on his last visit.  Comes in today because he feels like it is getting worse.  Reports a pressure behind his eye.  No fevers.  Additionally yesterday, he had some drainage that sounds like purulent drainage from his right nare.  He does report some tingling sensation but has full sensation on my evaluation.  No cranial nerve deficits.  No obvious abscess identified.  On initially arrival, he is afebrile, nontoxic-appearing.  Vital signs are stable.  Given that this is his fourth visit and continues to have worsening pain with pressure going behind his eye as well as questionable purulent drainage, will plan for CT imaging for evaluation of any possible mass versus abscess.  BMP shows potassium 3.4.  Otherwise unremarkable.  CBC with no significant leukocytosis or anemia.  CT maxillofacial shows complicated right maxillary sinus with spread of infection or edema into the retromaxillary space.  There is mildly dehiscent appearance of the posterior wall of the sinus but no other complicating features.  Discussed patient with Dr. Pollyann Kennedy (ENT) who reviewed patient's image.  He recommends patient be transferred to [facility] ED where he can evaluate patient.  Patient may need possible surgery.  We will plan to get Covid test.  I updated patient on plan.   He is agreeable.  He will go to [facility] ED.  Instructed him not to eat or drink anything. Discussed patient with Dr. Adriana Simas who is agreeable to plan.   Portions of this note were generated with Scientist, clinical (histocompatibility and immunogenetics). Dictation errors may occur despite best attempts at proofreading.   Final Clinical Impression(s) / ED Diagnoses Final diagnoses:  Acute maxillary sinusitis, recurrence not specified    Rx / DC Orders ED Discharge Orders    None       Rosana Hoes 05/13/19 2050    Donnetta Hutching, MD 05/13/19 2236

## 2019-05-13 NOTE — ED Notes (Addendum)
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

## 2019-05-13 NOTE — ED Notes (Addendum)
Call to [facility] ER  Brittney, RN, CN , ED  Is apprised of patient enroute As discharged from this ED  He is to meet Dr Pollyann Kennedy, ENT and has IV LAC as well as  Paperwork for ED and ENT  Brittney, RN, CN, ED has received report

## 2019-05-13 NOTE — ED Triage Notes (Signed)
This is 3rd visit to ED for sinus infection.  Pt completed Amoxillin on first visit, given Doxycycline on Wednesday, on 5 th dose.  Antibiotics and pain meds not working.  C/o pain right eye, right facial and right temporal, 10/10.

## 2019-05-13 NOTE — Consult Note (Signed)
Reason for Consult: Sinusitis Referring Physician: Wynetta Fines, MD  Eddie Hart is an 34 y.o. male.  HPI: Previously healthy young man, moved to this area from [place] about a month ago.  About the same time that he moved he started developing pain and pressure in the right side of his face and retro-orbital area.  He denies any blurred vision or difficulty with vision but he just feels that something is not right with his eye.  He has pain and pressure along the right zygomatic area and back to the front of the ear.  He has had some bloody discharge from his nose.  He has been on nasal steroid inhaler for about a month.  It has not really helped.  He typically suffers with seasonal allergies.  He takes antihistamine and decongestant for that on a regular basis.  He uses a Nettie pot about once daily.  It seems to cause worsening symptoms.  He denies any paresthesias of the facial area.  Past Medical History:  Diagnosis Date  . Asthma   . GERD (gastroesophageal reflux disease)     Past Surgical History:  Procedure Laterality Date  . HERNIA REPAIR      History reviewed. No pertinent family history.  Social History:  reports that he has been smoking cigarettes. He has never used smokeless tobacco. He reports current alcohol use. He reports previous drug use.  Allergies: No Known Allergies  Medications: Reviewed  Results for orders placed or performed during the hospital encounter of 05/13/19 (from the past 48 hour(s))  CBC with Differential     Status: Abnormal   Collection Time: 05/13/19  6:05 PM  Result Value Ref Range   WBC 10.3 4.0 - 10.5 K/uL   RBC 4.35 4.22 - 5.81 MIL/uL   Hemoglobin 13.8 13.0 - 17.0 g/dL   HCT 26.9 48.5 - 46.2 %   MCV 92.6 80.0 - 100.0 fL   MCH 31.7 26.0 - 34.0 pg   MCHC 34.2 30.0 - 36.0 g/dL   RDW 70.3 50.0 - 93.8 %   Platelets 237 150 - 400 K/uL   nRBC 0.0 0.0 - 0.2 %   Neutrophils Relative % 79 %   Neutro Abs 8.2 (H) 1.7 - 7.7 K/uL   Lymphocytes Relative 12 %   Lymphs Abs 1.3 0.7 - 4.0 K/uL   Monocytes Relative 6 %   Monocytes Absolute 0.6 0.1 - 1.0 K/uL   Eosinophils Relative 2 %   Eosinophils Absolute 0.2 0.0 - 0.5 K/uL   Basophils Relative 1 %   Basophils Absolute 0.1 0.0 - 0.1 K/uL   Immature Granulocytes 0 %   Abs Immature Granulocytes 0.03 0.00 - 0.07 K/uL    Comment: Performed at [facility], [address], [place], [place] [address]  Basic metabolic panel     Status: Abnormal   Collection Time: 05/13/19  6:05 PM  Result Value Ref Range   Sodium 137 135 - 145 mmol/L   Potassium 3.4 (L) 3.5 - 5.1 mmol/L   Chloride 102 98 - 111 mmol/L   CO2 26 22 - 32 mmol/L   Glucose, Bld 145 (H) 70 - 99 mg/dL    Comment: Glucose reference range applies only to samples taken after fasting for at least 8 hours.   BUN 14 6 - 20 mg/dL   Creatinine, Ser 3.71 0.61 - 1.24 mg/dL   Calcium 9.3 8.9 - 69.6 mg/dL   GFR calc non Af Amer >60 >60 mL/min  GFR calc Af Amer >60 >60 mL/min   Anion gap 9 5 - 15    Comment: Performed at [facility], [address] [place], [place] [address]    CT Maxillofacial W Contrast  Result Date: 05/13/2019 CLINICAL DATA:  34 year old male with sinus infection for 1 month with right facial pain, temporal pain. EXAM: CT MAXILLOFACIAL WITH CONTRAST TECHNIQUE: Multidetector CT imaging of the maxillofacial structures was performed with intravenous contrast. Multiplanar CT image reconstructions were also generated. CONTRAST:  60mL OMNIPAQUE IOHEXOL 300 MG/ML  SOLN COMPARISON:  None. FINDINGS: Osseous: No acute dental finding. Mandible intact. There is heterogeneous sclerosis at the left mandible condyle (series 7, image 71) although no other left TMJ degeneration identified. No acute facial fracture identified. Central skull base and visible cervical vertebrae appear intact. Orbits: Intact orbital walls. Globes and bilateral orbits soft tissues remain within normal limits. Sinuses: Abnormal right maxillary  sinus with fluid level, additional non dependent mucosal thickening or loculated fluid, and a mildly dehiscent appearance of the posterior right maxillary wall (series 3, image 38) associated with abnormal retro maxillary inflammation/edema (series 2, image 35). The remainder of the right masticator space appears spared. The right pterygoid palatine fossa remains within normal limits. No soft tissue gas. No drainable fluid outside of the sinus. No other suspicious osseous changes identified about the right maxillary sinus. Minimal paranasal sinus mucosal thickening otherwise. The tympanic cavities and mastoids are clear. There is symmetric nasal cavity mucosal thickening. Left middle concha bullosa. Olfactory recesses remain pneumatized. Soft tissues: Negative visible larynx. Motion artifact at the oropharynx. Negative parapharyngeal, retropharyngeal, sublingual, submandibular, and parotid spaces. No upper cervical lymphadenopathy. The major vascular structures in the neck and at the skull base including the right IJ remain patent. Limited intracranial: Cavernous sinus appears to be patent and within normal limits. Negative visible brain parenchyma. IMPRESSION: 1. Complicated Right Maxillary Sinusitis, with spread of infection or edema into the retro-maxillary space as seen on series 2, image 35. Recommend ENT consultation. Associated mildly dehiscent appearance of the posterior wall of that sinus, but no other complicating features. 2. The remaining paranasal sinuses are well pneumatized. There is symmetric nasal cavity mucosal thickening raising the possibility of rhinitis. 3. Negative face CT elsewhere. Electronically Signed   By: Odessa Fleming M.D.   On: 05/13/2019 19:14    PPI:[id number] except as listed in admit H&P  Blood pressure 122/78, pulse 66, temperature 98.2 F (36.8 C), temperature source Oral, resp. rate 16, height 5\' 10"  (1.778 m), weight 98 kg, SpO2 100 %.  PHYSICAL EXAM: Overall appearance:   Healthy appearing, in no distress Head:  Normocephalic, atraumatic. Eyes: Mild edema of the upper cheek and lower lid area on the right.  EOMs are intact.  Vision is grossly intact.  Pupils react appropriately. Ears: External auditory canals are clear; tympanic membranes are intact in the middle ears are free of any effusion. Nose: External nose is healthy in appearance. Internal nasal exam free of any lesions or obstruction.  There is some dried bloody secretions in the right middle meatus. Oral Cavity/Pharynx:  There are no mucosal lesions or masses identified.  The right maxillary third molar is slightly tender to percussion. Larynx/Hypopharynx: Deferred Neuro:  No identifiable neurologic deficits. Neck: No palpable neck masses.  Studies Reviewed: Maxillofacial CT.  This reveals mucosal thickening in the right maxillary sinus anteroinferiorly and more posterior superiorly.  In between the sinus appears to be aerated.  The outflow tract does appear to be partially obstructed  at least.  Remaining paranasal sinuses are relatively clear.          Procedures: none   Assessment/Plan: Isolated right maxillary sinus disease without complete opacification.  No obvious orbital involvement.  This may be a dental origin possibly from the right maxillary third molar.  He has not been to a dentist in years.  Recommend he get to see a dentist as soon as possible.  He has been on Augmentin for 7 days and more recently on doxycycline.  Would like to change him over to clindamycin to get better anaerobic coverage and better coverage for possible dental source but also to cover typical sinus pathogens.  Recommend he use nasal saline spray on a frequent basis, 20-30 times daily.  Recommend he stop the nasal steroid inhaler.  He is instructed to look out for any signs of severe worsening such as fever, redness of the face, swelling, visual change etc. and to contact our office immediately if any of these  occur.  If he is doing well I will see him back early next week.  We did discuss the possible need for sinus surgery in the future if this does not clear.  Recommend he eat yogurt and/or probiotic daily with his antibiotic.  Izora Gala 05/13/2019, 10:00 PM

## 2019-05-13 NOTE — ED Notes (Signed)
Pt is enroute POV to [facility]

## 2019-05-13 NOTE — ED Notes (Signed)
Pt is discharged to go directly to [facility] ED to meet Dr Pollyann Kennedy ENT  Who has been contacted by Dr Adriana Simas and will meet there for eval

## 2019-05-13 NOTE — ED Notes (Signed)
To CT

## 2019-05-13 NOTE — ED Provider Notes (Signed)
Briefly this is a 34 year old male presenting to [facility] emergency department as a transfer for evaluation by ENT.  Patient is presenting with right-sided facial pain x4 weeks.  Work-up by previous provider includes CT scan showing right maxillary sinusitis with spread of infection into the retromaxillary space.  Labs overall unremarkable.  No leukocytosis or anemia.  9:09 PM Spoke with Dr. Pollyann Kennedy to let him know of patient's arrival at [facility] emergency department.  He will be in to evaluate the patient.  Patient is stable and resting comfortably.  10:00 PM Dr. Pollyann Kennedy evaluated patient at the bedside.  Please see his consult note.  Plan is for outpatient follow-up early next week.  Patient prescribed clindamycin to start taking immediately.    The patient appears reasonably screened and/or stabilized for discharge and I doubt any other medical condition or other [facility] requiring further screening, evaluation, or treatment in the ED at this time prior to discharge. The patient is safe for discharge with strict return precautions discussed.  Patient appears reliable for follow-up.   Kathyrn Lass 05/13/19 2240    Wynetta Fines, MD 05/20/19 [phone]

## 2019-05-13 NOTE — ED Notes (Signed)
Received phone call from [facility] regarding patient's positive covid test done here at [facility].  Phoned patient to inform him his test is positive.  Pt reports he was diagnosed with Covid a month ago and will continue to use precautions and wear a mask.

## 2019-05-13 NOTE — Discharge Instructions (Addendum)
Start antibiotic as soon as possible this evening.  Try to eat active culture yogurt and/or probiotic several times daily while you are taking this.  Stop using the Flonase nasal spray.  Use nasal saline spray 20-30 times daily at least.  You may take extra strength Tylenol 500 mg 2 every 6 hours but do not exceed that.  You may take ibuprofen 200 mg, 3 tablets every 6 hours or 4 tablets every 8 hours but do not exceed that.  You may also use naproxen at the maximum dose allowed instead of the ibuprofen.

## 2019-05-14 ENCOUNTER — Telehealth: Payer: Self-pay | Admitting: Unknown Physician Specialty

## 2019-05-14 NOTE — Telephone Encounter (Signed)
Called to discuss with patient about Covid symptoms and the use of bamlanivimab, a monoclonal antibody infusion for those with mild to moderate Covid symptoms and at a high risk of hospitalization.  Pt may be qualified for this treatment

## 2019-11-15 ENCOUNTER — Ambulatory Visit: Admit: 2019-11-15 | Discharge status: Absent without leave | Payer: Self-pay

## 2021-06-16 ENCOUNTER — Ambulatory Visit
Admission: RE | Admit: 2021-06-16 | Discharge: 2021-06-16 | Disposition: A | Discharge status: Home / Self Care | Payer: 59 | Source: Ambulatory Visit | Attending: Nurse Practitioner | Admitting: Nurse Practitioner

## 2021-06-16 VITALS — BP 147/88 | HR 82 | Temp 98.4°F | Resp 20

## 2021-06-16 DIAGNOSIS — S80862A Insect bite (nonvenomous), left lower leg, initial encounter: Secondary | ICD-10-CM | POA: Diagnosis not present

## 2021-06-16 DIAGNOSIS — W57XXXA Bitten or stung by nonvenomous insect and other nonvenomous arthropods, initial encounter: Secondary | ICD-10-CM

## 2021-06-16 DIAGNOSIS — H6983 Other specified disorders of Eustachian tube, bilateral: Secondary | ICD-10-CM

## 2021-06-16 MED ORDER — DOXYCYCLINE HYCLATE 100 MG PO TABS: 200.0000 mg | ORAL_TABLET | Freq: Once | ORAL | 0 refills | Status: AC

## 2021-06-16 MED ORDER — FLUTICASONE PROPIONATE 50 MCG/ACT NA SUSP: 2.0000 | Freq: Every day | NASAL | 0 refills | Status: AC

## 2021-06-16 NOTE — ED Triage Notes (Signed)
Pt presents with c/o tick bite on leg last week and has had body aches and ear pressure  ?

## 2021-06-16 NOTE — Discharge Instructions (Signed)
Take medication as prescribed.  Take medication with food. ?Continue to monitor the area where the tick bite occurred on your leg.  As discussed, you will be looking for a bull's-eye rash, compared to the pictures that we looked at during your visit. ?Continue the Zyrtec you are currently taking at this time. ?Increase fluids and get plenty of rest. ?Follow-up if your symptoms worsen or do not improve. ?

## 2021-06-16 NOTE — ED Provider Notes (Signed)
?[facility] ? ? ? ?CSN: [id number] ?Arrival date & time: 06/16/21  1032 ? ? ?  ? ?History   ?Chief Complaint ?Chief Complaint  ?Patient presents with  ? Insect Bite  ?  Possible lymes disease - Entered by patient  ? ? ?HPI ?Eddie Hart is a 36 y.o. male.  ? ?The patient is a 36 year old male who presents with concern about a tick bite and bilateral ear pressure.  Symptoms from the tick bite started approximately 1 week ago.  He states that the tick was found approximately no more than 6 hours after he ate less checked the same site.  He states the tick was large and it was dark brown and black.  He states that he has since had an episode of fatigue, which is since improved.  He states that the episode started 2 days ago, and lasted approximately 12 hours.  He states since that time he feels more energized.  With regard to the ears, he states his ears have had popping, pressure, and whooshing noises for over the past 1 to 2 weeks.  He does have a history of seasonal allergies.  States he has been taking Zyrtec over the past week after he found the tick bite.  He denies fever, chills, headache, abdominal pain, nausea, vomiting, diarrhea, or rash. ? ?The history is provided by the patient.  ? ?Past Medical History:  ?Diagnosis Date  ? Asthma   ? GERD (gastroesophageal reflux disease)   ? ? ?There are no problems to display for this patient. ? ? ?Past Surgical History:  ?Procedure Laterality Date  ? HERNIA REPAIR    ? ? ? ? ? ?Home Medications   ? ?Prior to Admission medications   ?Medication Sig Start Date End Date Taking? Authorizing Provider  ?doxycycline (VIBRA-TABS) 100 MG tablet Take 2 tablets (200 mg total) by mouth once for 1 dose. 06/16/21 06/16/21 Yes Kamir Selover-Warren, Sadie Haber, NP  ?fluticasone (FLONASE) 50 MCG/ACT nasal spray Place 2 sprays into both nostrils daily. 06/16/21  Yes Merrianne Mccumbers-Warren, Sadie Haber, NP  ?acetaminophen (TYLENOL) 500 MG tablet Take 1,500 mg by mouth every 6 (six) hours as  needed for mild pain, fever or headache.    [provider]  ?albuterol (VENTOLIN HFA) 108 (90 Base) MCG/ACT inhaler Inhale 1-2 puffs into the lungs every 6 (six) hours as needed for wheezing or shortness of breath.    [provider]  ?amoxicillin-clavulanate (AUGMENTIN) 875-125 MG tablet Take 1 tablet by mouth every 12 (twelve) hours. ?Patient not taking: Reported on 05/13/2019 05/04/19   Durward Parcel, FNP  ?cetirizine (ZYRTEC) 10 MG tablet Take 10 mg by mouth daily.    [provider]  ?cholecalciferol (VITAMIN D3) 25 MCG (1000 UNIT) tablet Take 5,000 Units by mouth daily.    [provider]  ?HYDROcodone-acetaminophen (NORCO) 5-325 MG tablet Take 1 tablet by mouth every 4 (four) hours as needed for moderate pain. ?Patient not taking: Reported on 05/13/2019 05/11/19   Elson Areas, PA-C  ?HYDROcodone-acetaminophen (NORCO/VICODIN) 5-325 MG tablet Take 1 tablet by mouth every 4 (four) hours as needed. 05/11/19   Elson Areas, PA-C  ?naproxen (NAPROSYN) 500 MG tablet Take 1 tablet (500 mg total) by mouth 2 (two) times daily with a meal. 05/07/19   Triplett, Tammy, PA-C  ?omeprazole (PRILOSEC) 20 MG capsule Take 20 mg by mouth in the morning and at bedtime.    [provider]  ?predniSONE (DELTASONE) 10 MG tablet Take 6 tablets  day one, 5 tablets day two, 4 tablets day three, 3 tablets day four, 2 tablets day five, then 1 tablet day six ?Patient not taking: Reported on 05/13/2019 05/07/19   Pauline Ausriplett, Tammy, PA-C  ?pseudoephedrine (SUDAFED) 120 MG 12 hr tablet Take 120 mg by mouth every 12 (twelve) hours as needed for congestion.    [provider]  ? ? ?Family History ?History reviewed. No pertinent family history. ? ?Social History ?Social History  ? ?Tobacco Use  ? Smoking status: Some Days  ?  Types: Cigarettes  ? Smokeless tobacco: Never  ?Substance Use Topics  ? Alcohol use: Yes  ? Drug use: Not Currently  ? ? ? ?Allergies   ?Patient has no known  allergies. ? ? ?Review of Systems ?Review of Systems  ?Constitutional:  Positive for fatigue.  ?HENT:  Positive for ear pain (bilateral ear pressure).   ?Eyes: Negative.   ?Respiratory: Negative.    ?Cardiovascular: Negative.   ?Gastrointestinal: Negative.   ?Skin:   ?     Tick bite to posterior left lower leg  ?Psychiatric/Behavioral: Negative.    ? ? ?Physical Exam ?Triage Vital Signs ?ED Triage Vitals  ?Enc Vitals Group  ?   BP 06/16/21 1105 (!) 147/88  ?   Pulse Rate 06/16/21 1105 82  ?   Resp 06/16/21 1105 20  ?   Temp 06/16/21 1105 98.4 ?F (36.9 ?C)  ?   Temp src --   ?   SpO2 06/16/21 1105 98 %  ?   Weight --   ?   Height --   ?   Head Circumference --   ?   Peak Flow --   ?   Pain Score 06/16/21 1103 0  ?   Pain Loc --   ?   Pain Edu? --   ?   Excl. in GC? --   ? ?No data found. ? ?Updated Vital Signs ?BP (!) 147/88   Pulse 82   Temp 98.4 ?F (36.9 ?C)   Resp 20   SpO2 98%  ? ?Visual Acuity ?Right Eye Distance:   ?Left Eye Distance:   ?Bilateral Distance:   ? ?Right Eye Near:   ?Left Eye Near:    ?Bilateral Near:    ? ?Physical Exam ?Vitals reviewed.  ?Constitutional:   ?   General: He is not in acute distress. ?   Appearance: Normal appearance.  ?HENT:  ?   Head: Normocephalic and atraumatic.  ?   Right Ear: Ear canal and external ear normal. A middle ear effusion is present.  ?   Left Ear: Ear canal and external ear normal. A middle ear effusion is present.  ?   Nose: Nose normal.  ?   Mouth/Throat:  ?   Mouth: Mucous membranes are moist.  ?Eyes:  ?   Extraocular Movements: Extraocular movements intact.  ?   Conjunctiva/sclera: Conjunctivae normal.  ?   Pupils: Pupils are equal, round, and reactive to light.  ?Cardiovascular:  ?   Rate and Rhythm: Regular rhythm.  ?Pulmonary:  ?   Effort: Pulmonary effort is normal.  ?   Breath sounds: Normal breath sounds.  ?Abdominal:  ?   General: Bowel sounds are normal.  ?   Tenderness: There is no abdominal tenderness.  ?Musculoskeletal:  ?   Cervical back: Normal  range of motion.  ?Skin: ?   General: Skin is warm and dry.  ?   Capillary Refill: Capillary refill takes less than 2 seconds.  ?  Neurological:  ?   General: No focal deficit present.  ?   Mental Status: He is alert and oriented to person, place, and time.  ?Psychiatric:     ?   Mood and Affect: Mood normal.     ?   Behavior: Behavior normal.  ? ? ? ?UC Treatments / Results  ?Labs ?(all labs ordered are listed, but only abnormal results are displayed) ?Labs Reviewed - No data to display ? ?EKG ? ? ?Radiology ?No results found. ? ?Procedures ?Procedures (including critical care time) ? ?Medications Ordered in UC ?Medications - No data to display ? ?Initial Impression / Assessment and Plan / UC Course  ?I have reviewed the triage vital signs and the nursing notes. ? ?Pertinent labs & imaging results that were available during my care of the patient were reviewed by me and considered in my medical decision making (see chart for details). ? ?The patient is a 36 year old male who presents for tick bite and bilateral ear pressure.  Tick bite occurred 1 week ago.  Patient states that he has since had intermittent fatigue.  Fatigue has improved over the past 2 to 3 days.  He does have a tick bite to the posterior aspect of his left lower leg.  There are no symptoms of erythema migrans at this time.  Tick was not present for more than 72 hours.  We will go ahead and treat the patient prophylactically with doxycycline 200 mg.  With regard to his bilateral ear pressure, we will start him on fluticasone.  There is no bulging or erythema in the ER to indicate an otitis media.  Patient advised to also continue taking his Zyrtec.  Patient was given strict return precautions.  Follow-up as needed. ?Final Clinical Impressions(s) / UC Diagnoses  ? ?Final diagnoses:  ?Tick bite of left lower leg, initial encounter  ?Acute dysfunction of Eustachian tube, bilateral  ? ? ? ?Discharge Instructions   ? ?  ?Take medication as prescribed.   Take medication with food. ?Continue to monitor the area where the tick bite occurred on your leg.  As discussed, you will be looking for a bull's-eye rash, compared to the pictures that we looked at during your visit.

## 2021-07-22 ENCOUNTER — Ambulatory Visit: Payer: 59

## 2021-09-18 IMAGING — CT CT MAXILLOFACIAL W/ CM
3 of 4 series · 15 of 47 positions shown, 18 images · IV contrast (Omnipaque or Isovue)
Comparison: None.

CLINICAL DATA: 34-year-old male with sinus infection for 1 month
with right facial pain, temporal pain.

EXAM:
CT MAXILLOFACIAL WITH CONTRAST
TECHNIQUE: Multidetector CT imaging of the maxillofacial structures was
performed with intravenous contrast. Multiplanar CT image
reconstructions were also generated.
CONTRAST:  75mL OMNIPAQUE IOHEXOL 300 MG/ML  SOLN

[Series 2: max soft · axial · 0.38mm/px · z∈[-192,-34]mm · 10 of 93 slices shown, 13 images]
[im 7/93  brain]
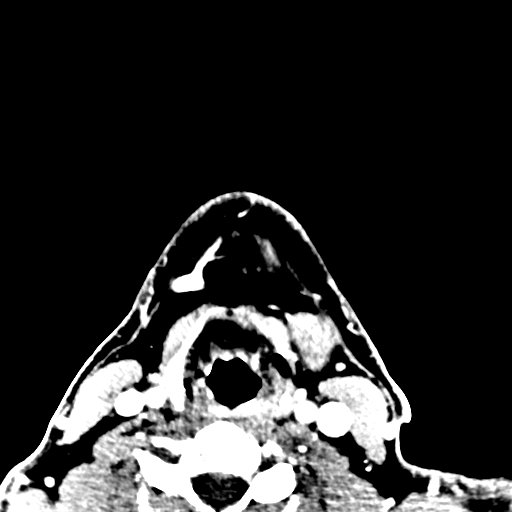
[im 7/93  bone]
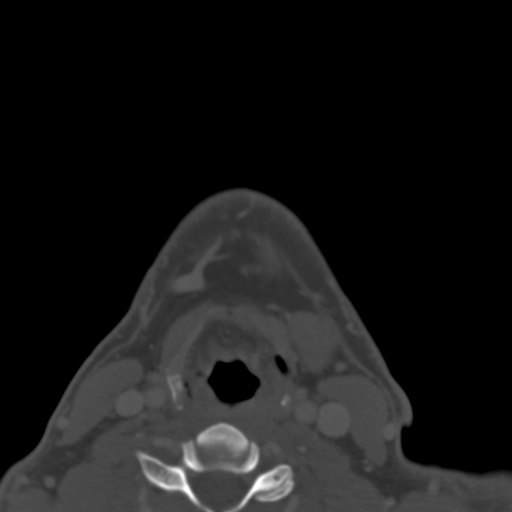
[im 16/93  bone]
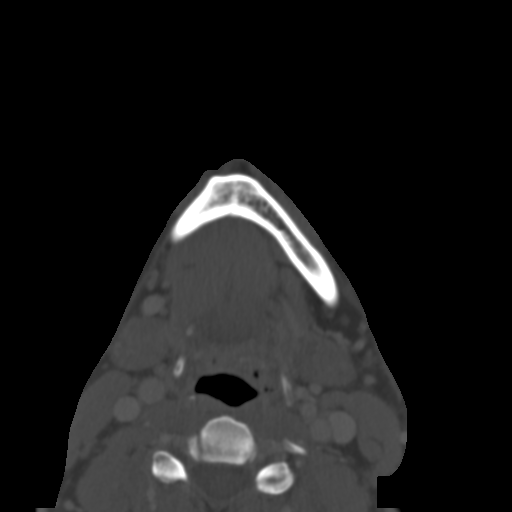
[im 26/93  bone]
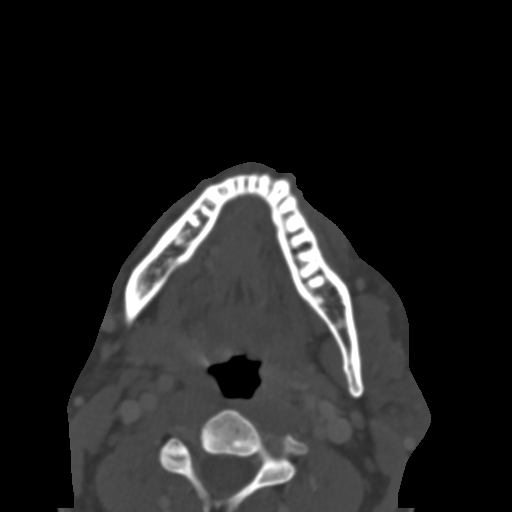
[im 32/93  bone]
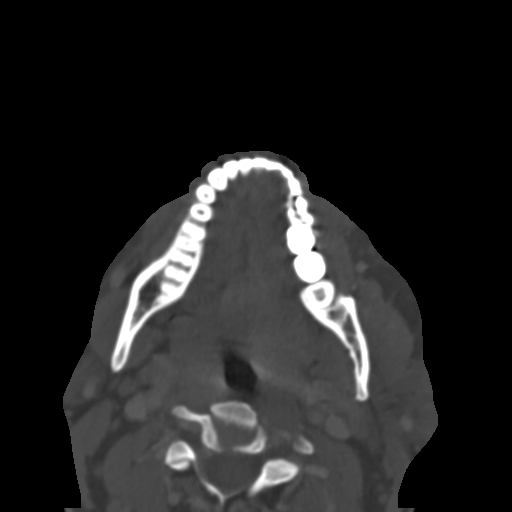
[im 42/93  brain]
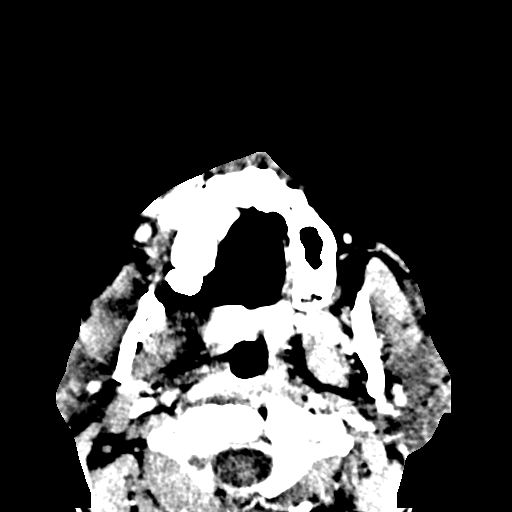
[im 42/93  bone]
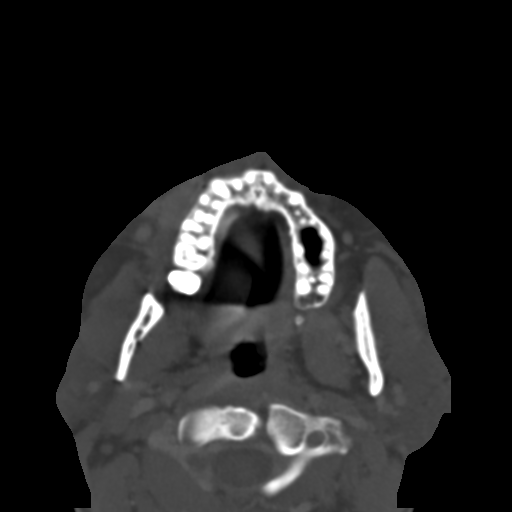
[im 51/93  bone]
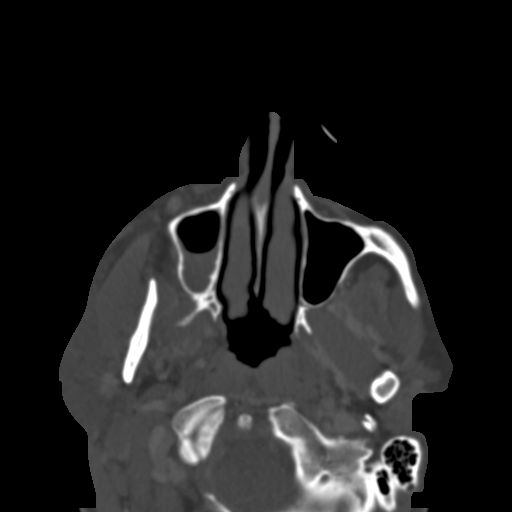
[im 61/93  bone]
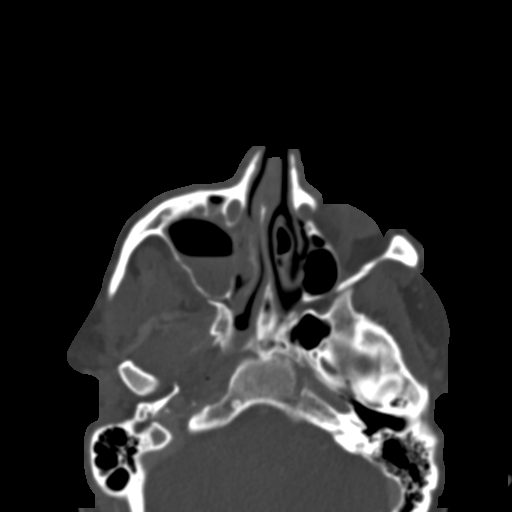
[im 70/93  bone]
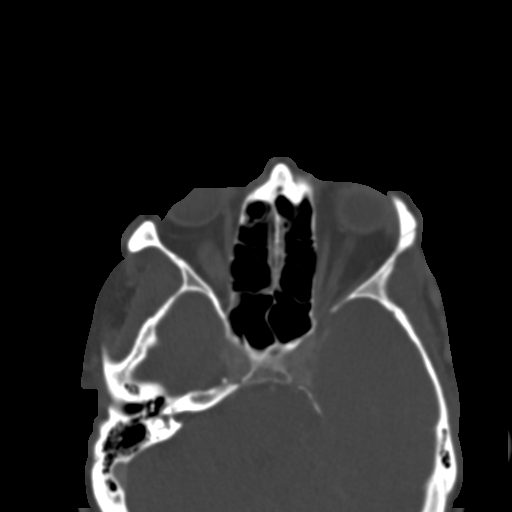
[im 77/93  brain]
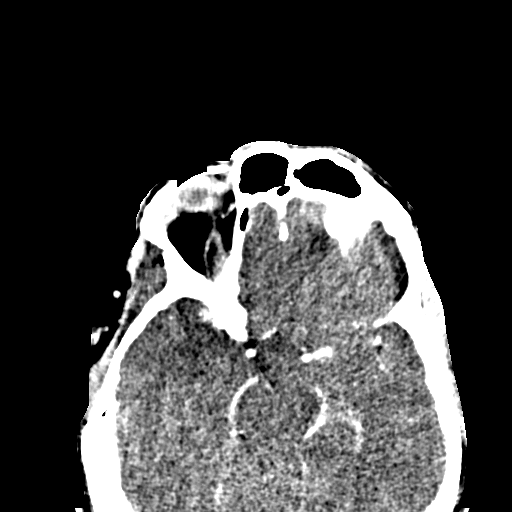
[im 77/93  bone]
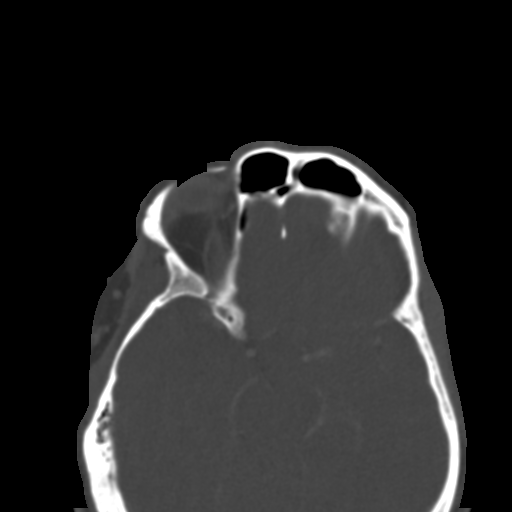
[im 86/93  bone]
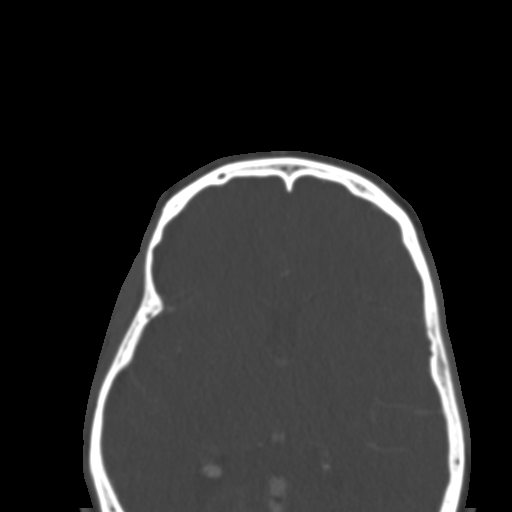

[Series 4: coronal soft · coronal · 0.41mm/px · 3 of 78 slices shown]
[im 26/78  bone]
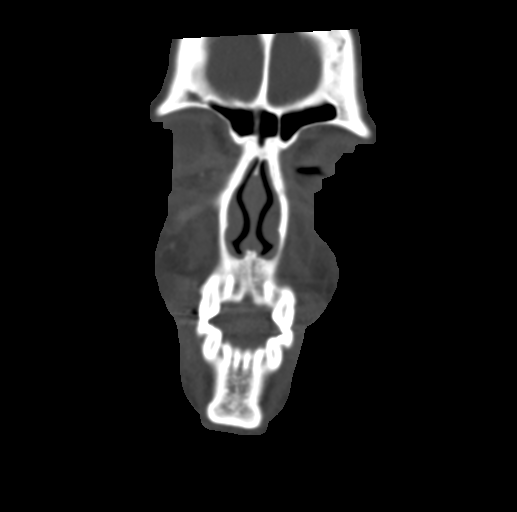
[im 35/78  bone]
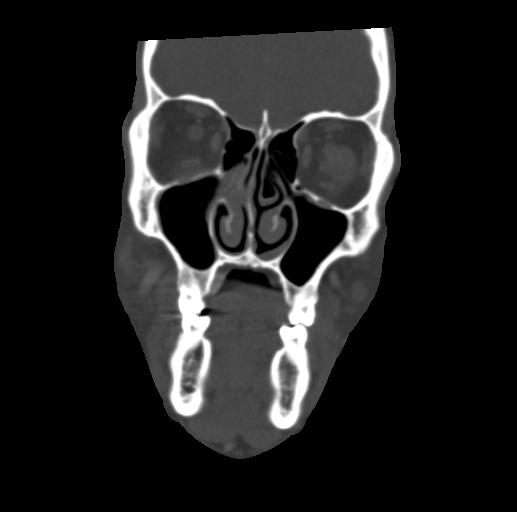
[im 43/78  bone]
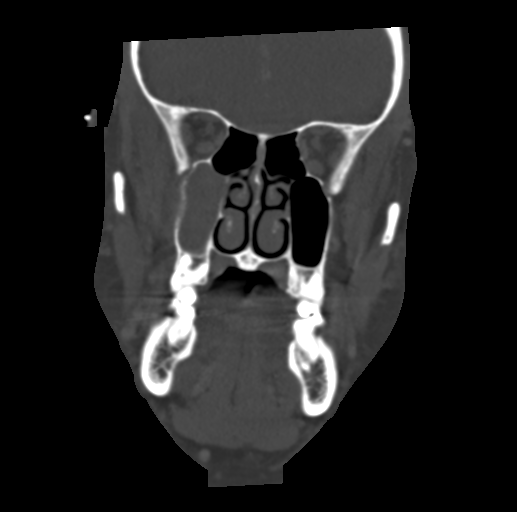

[Series 7: sagittal bone · sagittal · 0.32mm/px · 2 of 94 slices shown]
[im 32/94  bone]
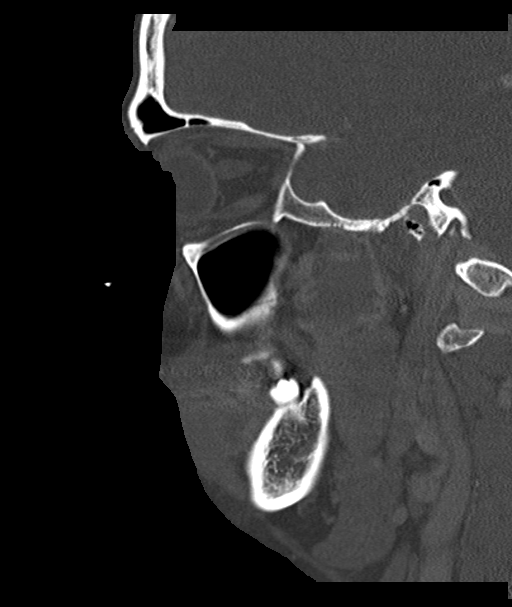
[im 63/94  bone]
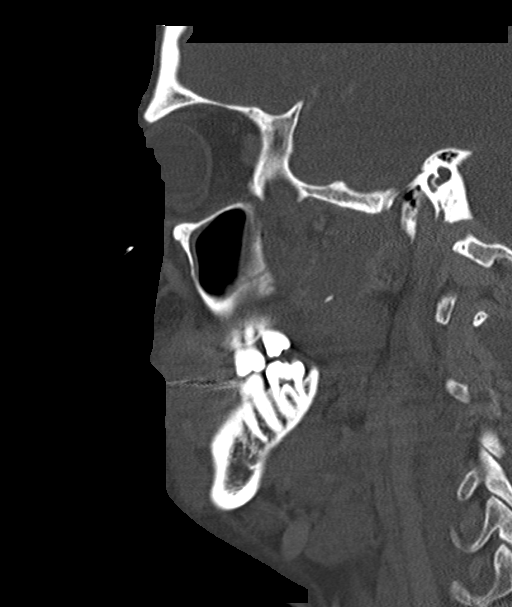

[15 of 47 positions shown; findings below may reference images not displayed]

FINDINGS: Osseous: No acute dental finding. Mandible intact. There is
heterogeneous sclerosis at the left mandible condyle (series 7,
image 71) although no other left TMJ degeneration identified.

No acute facial fracture identified. Central skull base and visible
cervical vertebrae appear intact.

Orbits: Intact orbital walls. Globes and bilateral orbits soft
tissues remain within normal limits.

Sinuses: Abnormal right maxillary sinus with fluid level, additional
non dependent mucosal thickening or loculated fluid, and a mildly
dehiscent appearance of the posterior right maxillary wall (series
3, image 38) associated with abnormal retro maxillary
inflammation/edema (series 2, image 35). The remainder of the right
masticator space appears spared. The right pterygoid palatine fossa
remains within normal limits. No soft tissue gas. No drainable fluid
outside of the sinus. No other suspicious osseous changes identified
about the right maxillary sinus.

Minimal paranasal sinus mucosal thickening otherwise. The tympanic
cavities and mastoids are clear. There is symmetric nasal cavity
mucosal thickening. Left middle concha bullosa. Olfactory recesses
remain pneumatized.

Soft tissues: Negative visible larynx. Motion artifact at the
oropharynx. Negative parapharyngeal, retropharyngeal, sublingual,
submandibular, and parotid spaces.

No upper cervical lymphadenopathy.

The major vascular structures in the neck and at the skull base
including the right IJ remain patent.

Limited intracranial: Cavernous sinus appears to be patent and
within normal limits. Negative visible brain parenchyma.
IMPRESSION: 1. Complicated Right Maxillary Sinusitis, with spread of infection
or edema into the retro-maxillary space as seen on series 2, image
35. Recommend ENT consultation. Associated mildly dehiscent
appearance of the posterior wall of that sinus, but no other
complicating features.

2. The remaining paranasal sinuses are well pneumatized. There is
symmetric nasal cavity mucosal thickening raising the possibility of
rhinitis.

3. Negative face CT elsewhere.

## 2022-01-24 DIAGNOSIS — L309 Dermatitis, unspecified: Secondary | ICD-10-CM | POA: Diagnosis not present

## 2022-03-01 DIAGNOSIS — J0100 Acute maxillary sinusitis, unspecified: Secondary | ICD-10-CM | POA: Diagnosis not present

## 2022-06-14 DIAGNOSIS — R202 Paresthesia of skin: Secondary | ICD-10-CM | POA: Diagnosis not present

## 2023-05-31 ENCOUNTER — Ambulatory Visit (INDEPENDENT_AMBULATORY_CARE_PROVIDER_SITE_OTHER)

## 2023-05-31 ENCOUNTER — Ambulatory Visit
Admission: RE | Admit: 2023-05-31 | Discharge: 2023-05-31 | Disposition: A | Discharge status: Home / Self Care | Source: Ambulatory Visit

## 2023-05-31 VITALS — BP 139/90 | HR 111 | Temp 97.8°F | Resp 16

## 2023-05-31 DIAGNOSIS — M25572 Pain in left ankle and joints of left foot: Secondary | ICD-10-CM | POA: Diagnosis not present

## 2023-05-31 DIAGNOSIS — S93402A Sprain of unspecified ligament of left ankle, initial encounter: Secondary | ICD-10-CM

## 2023-05-31 DIAGNOSIS — Z043 Encounter for examination and observation following other accident: Secondary | ICD-10-CM | POA: Diagnosis not present

## 2023-05-31 NOTE — ED Triage Notes (Signed)
 Pt reports left foot and ankle pain on the outer side. , states he thinks he may have injured it while at work on Friday. He was going down hill when his ankle falling down a little. Woke up yesterday and fell when trying to stand on the left foot.

## 2023-05-31 NOTE — Discharge Instructions (Addendum)
 Your x-rays of your left ankle were negative for fracture or dislocation. You likely sprained your left ankle.   Wear the ankle walker boot we provided in the clinic for the next couple of weeks to provide compression, stability, and comfort.  Please rest, ice, and elevate your ankle to help it heal and decrease inflammation.   Take 600mg  ibuprofen and/or 1,000mg  tylenol every 6 hours as needed for pain and inflammation. Take with food to avoid stomach upset.  Call the orthopedic provider listed on your discharge paperwork to schedule a follow-up appointment if your symptoms do not improve in the next 1-2 weeks with supportive care.  Return to urgent care if you experience worsening pain, numbness, tingling, change of color in your skin near the injury, or any other concerning symptoms.  I hope you feel better!

## 2023-05-31 NOTE — ED Provider Notes (Signed)
 [facility]    CSN: [id number] Arrival date & time: 05/31/23  0844      History   Chief Complaint Chief Complaint  Patient presents with   Foot Pain    I can barely walk or put weight on my left foot. Ankle is slightly swollen but not sure exactly what has happened - Entered by patient    HPI Eddie Hart is a 38 y.o. male.   Patient presents to urgent care for evaluation of pain and swelling to the left lateral ankle that started during the early hours of Friday night into Saturday morning (April 12th) at 1am when he got up to use the restroom. He got out of bed and put weight on the left ankle, then felt the ankle "give out" causing him to have significant pain.  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  Denies previous injury to the left ankle, paresthesias, and unilateral extremity weakness. Pain is triggered by weightbearing activity and walking and is tolerable at rest when in certain positions. He took ibuprofen last night and states this "took the pain away completely".    Foot Pain    Past Medical History:  Diagnosis Date   Asthma    GERD (gastroesophageal reflux disease)     There are no active problems to display for this patient.   Past Surgical History:  Procedure Laterality Date   HERNIA REPAIR         Home Medications    Prior to Admission medications   Medication Sig Start Date End Date Taking? Authorizing Provider  magnesium citrate SOLN Take 1 Bottle by mouth once.   Yes [provider]  zinc gluconate 50 MG tablet Take 50 mg by mouth daily.   Yes [provider]  acetaminophen (TYLENOL) 500 MG tablet Take 1,500 mg by mouth every 6 (six) hours as needed for mild pain, fever or headache.    [provider]  albuterol (VENTOLIN HFA) 108 (90 Base) MCG/ACT inhaler Inhale 1-2 puffs into the lungs every 6  (six) hours as needed for wheezing or shortness of breath.    [provider]  amoxicillin-clavulanate (AUGMENTIN) 875-125 MG tablet Take 1 tablet by mouth every 12 (twelve) hours. Patient not taking: Reported on 05/13/2019 05/04/19   Avegno, Komlanvi S, FNP  cetirizine (ZYRTEC) 10 MG tablet Take 10 mg by mouth daily.    [provider]  cholecalciferol (VITAMIN D3) 25 MCG (1000 UNIT) tablet Take 5,000 Units by mouth daily.    [provider]  fluticasone (FLONASE) 50 MCG/ACT nasal spray Place 2 sprays into both nostrils daily. 06/16/21   Leath-Warren, Belen Bowers, NP  HYDROcodone-acetaminophen (NORCO) 5-325 MG tablet Take 1 tablet by mouth every 4 (four) hours as needed for moderate pain. Patient not taking: Reported on 05/13/2019 05/11/19   Sofia, Leslie K, PA-C  HYDROcodone-acetaminophen (NORCO/VICODIN) 5-325 MG tablet Take 1 tablet by mouth every 4 (four) hours as needed. 05/11/19   Sofia, Leslie K, PA-C  naproxen (NAPROSYN) 500 MG tablet Take 1 tablet (500 mg total) by mouth 2 (two) times daily with a meal. 05/07/19   Triplett, Tammy, PA-C  omeprazole (PRILOSEC) 20 MG capsule Take 20 mg by mouth in the morning and at bedtime.    [provider]  predniSONE (DELTASONE) 10 MG tablet Take 6 tablets day one, 5 tablets day two,  4 tablets day three, 3 tablets day four, 2 tablets day five, then 1 tablet day six Patient not taking: Reported on 05/13/2019 05/07/19   Catherne Clubs, PA-C  pseudoephedrine (SUDAFED) 120 MG 12 hr tablet Take 120 mg by mouth every 12 (twelve) hours as needed for congestion.    [provider]    Family History History reviewed. No pertinent family history.  Social History Social History   Tobacco Use   Smoking status: Some Days    Types: Cigarettes   Smokeless tobacco: Never  Substance Use Topics   Alcohol use: Yes   Drug use: Not Currently     Allergies   Patient has no known allergies.   Review of Systems Review of  Systems Per HPI  Physical Exam Triage Vital Signs ED Triage Vitals [05/31/23 0919]  Encounter Vitals Group     BP (!) 139/90     Systolic BP Percentile      Diastolic BP Percentile      Pulse Rate (!) 111     Resp 16     Temp 97.8 F (36.6 C)     Temp Source Oral     SpO2 96 %     Weight      Height      Head Circumference      Peak Flow      Pain Score      Pain Loc      Pain Education      Exclude from Growth Chart    No data found.  Updated Vital Signs BP (!) 139/90 (BP Location: Right Arm)   Pulse (!) 111   Temp 97.8 F (36.6 C) (Oral)   Resp 16   SpO2 96%   Visual Acuity Right Eye Distance:   Left Eye Distance:   Bilateral Distance:    Right Eye Near:   Left Eye Near:    Bilateral Near:     Physical Exam Vitals and nursing note reviewed.  Constitutional:      Appearance: He is not ill-appearing or toxic-appearing.  HENT:     Head: Normocephalic and atraumatic.     Right Ear: Hearing and external ear normal.     Left Ear: Hearing and external ear normal.     Nose: Nose normal.     Mouth/Throat:     Lips: Pink.  Eyes:     General: Lids are normal. Vision grossly intact. Gaze aligned appropriately.     Extraocular Movements: Extraocular movements intact.     Conjunctiva/sclera: Conjunctivae normal.  Pulmonary:     Effort: Pulmonary effort is normal.  Musculoskeletal:     Cervical back: Neck supple.     Right ankle: Normal.     Right Achilles Tendon: Normal.     Left ankle: Swelling (Diffuse soft tissue swelling to the lateral malleolus) present. No deformity, ecchymosis or lacerations. Tenderness present over the lateral malleolus. No medial malleolus or proximal fibula tenderness. Normal range of motion (Full ROM of the left ankle). Anterior drawer test negative. Normal pulse.     Left Achilles Tendon: Normal.     Right foot: Normal.     Left foot: Normal.     Comments: 5/5 strength against resistance with dorsiflexion and plantarflexion of the  left ankle.  Sensation intact distally.  Less than 2 cap refill.  +2 bilateral dorsalis pedis pulses.  Skin:    General: Skin is warm and dry.     Capillary Refill: Capillary refill takes less  than 2 seconds.     Findings: No rash.  Neurological:     General: No focal deficit present.     Mental Status: He is alert and oriented to person, place, and time. Mental status is at baseline.     Cranial Nerves: No dysarthria or facial asymmetry.     Gait: Gait abnormal (Walks with limp favoring the left foot).  Psychiatric:        Mood and Affect: Mood normal.        Speech: Speech normal.        Behavior: Behavior normal.        Thought Content: Thought content normal.        Judgment: Judgment normal.      UC Treatments / Results  Labs (all labs ordered are listed, but only abnormal results are displayed) Labs Reviewed - No data to display  EKG   Radiology No results found.  Procedures Procedures (including critical care time)  Medications Ordered in UC Medications - No data to display  Initial Impression / Assessment and Plan / UC Course  I have reviewed the triage vital signs and the nursing notes.  Pertinent labs & imaging results that were available during my care of the patient were reviewed by me and considered in my medical decision making (see chart for details).   1.  Sprain of left ankle, left ankle pain Imaging is negative for acute fracture or dislocation. We will manage this with conservative treatment as an acute sprain to the left ankle.  RICE advised.   CAM Walker boot applied in clinic and patient may use this as needed for compression and stability.  May use naproxen as needed for pain and swelling at home.  Walking referral to orthopedics given should symptoms fail to improve in the next few weeks with conservative care.   Counseled patient on potential for adverse effects with medications prescribed/recommended today, strict ER and return-to-clinic  precautions discussed, patient verbalized understanding.    Final Clinical Impressions(s) / UC Diagnoses   Final diagnoses:  Acute left ankle pain  Sprain of left ankle, unspecified ligament, initial encounter     Discharge Instructions      Your x-rays of your left ankle were negative for fracture or dislocation. You likely sprained your left ankle.   Wear the ankle walker boot we provided in the clinic for the next couple of weeks to provide compression, stability, and comfort.  Please rest, ice, and elevate your ankle to help it heal and decrease inflammation.   Take 600mg  ibuprofen and/or 1,000mg  tylenol every 6 hours as needed for pain and inflammation. Take with food to avoid stomach upset.  Call the orthopedic provider listed on your discharge paperwork to schedule a follow-up appointment if your symptoms do not improve in the next 1-2 weeks with supportive care.  Return to urgent care if you experience worsening pain, numbness, tingling, change of color in your skin near the injury, or any other concerning symptoms.  I hope you feel better!     ED Prescriptions   None    PDMP not reviewed this encounter.   Starlene Eaton, [place] 05/31/23 1008

## 2023-10-05 ENCOUNTER — Telehealth: Payer: Self-pay

## 2023-10-05 ENCOUNTER — Ambulatory Visit
Admission: RE | Admit: 2023-10-05 | Discharge: 2023-10-05 | Disposition: A | Discharge status: Home / Self Care | Source: Ambulatory Visit | Attending: Family Medicine | Admitting: Family Medicine

## 2023-10-05 VITALS — BP 127/86 | HR 91 | Temp 99.6°F | Resp 16

## 2023-10-05 DIAGNOSIS — N50811 Right testicular pain: Secondary | ICD-10-CM | POA: Diagnosis not present

## 2023-10-05 DIAGNOSIS — R1013 Epigastric pain: Secondary | ICD-10-CM | POA: Diagnosis not present

## 2023-10-05 DIAGNOSIS — N5089 Other specified disorders of the male genital organs: Secondary | ICD-10-CM | POA: Diagnosis not present

## 2023-10-05 LAB — POCT URINE DIPSTICK
Bilirubin, UA: NEGATIVE
Blood, UA: NEGATIVE
Glucose, UA: NEGATIVE mg/dL
Ketones, POC UA: NEGATIVE mg/dL
Leukocytes, UA: NEGATIVE
Nitrite, UA: NEGATIVE
POC PROTEIN,UA: NEGATIVE
Spec Grav, UA: 1.015 (ref 1.010–1.025)
Urobilinogen, UA: 0.2 U/dL
pH, UA: 6.5 (ref 5.0–8.0)

## 2023-10-05 MED ORDER — PANTOPRAZOLE SODIUM 40 MG PO TBEC: 40.0000 mg | DELAYED_RELEASE_TABLET | Freq: Two times a day (BID) | ORAL | 0 refills | Status: DC

## 2023-10-05 MED ORDER — LIDOCAINE VISCOUS HCL 2 % MT SOLN
15.0000 mL | Freq: Once | OROMUCOSAL | Status: AC
  Administered 2023-10-05: 15 mL via OROMUCOSAL

## 2023-10-05 MED ORDER — PANTOPRAZOLE SODIUM 40 MG PO TBEC: 40.0000 mg | DELAYED_RELEASE_TABLET | Freq: Two times a day (BID) | ORAL | 0 refills | Status: AC

## 2023-10-05 MED ORDER — ALUM & MAG HYDROXIDE-SIMETH 200-200-20 MG/5ML PO SUSP
30.0000 mL | Freq: Once | ORAL | Status: AC
  Administered 2023-10-05: 30 mL via ORAL

## 2023-10-05 MED ORDER — SUCRALFATE 1 G PO TABS: 1.0000 g | ORAL_TABLET | Freq: Three times a day (TID) | ORAL | 0 refills | Status: AC | PRN

## 2023-10-05 MED ORDER — SUCRALFATE 1 G PO TABS: 1.0000 g | ORAL_TABLET | Freq: Three times a day (TID) | ORAL | 0 refills | Status: DC | PRN

## 2023-10-05 NOTE — ED Provider Notes (Signed)
 [facility]    CSN: [id number] Arrival date & time: 10/05/23  8367      History   Chief Complaint Chief Complaint  Patient presents with   Abdominal Pain    started Friday night persisted allday Saturday Sunday little better during the day after I took some Gas-X. The pain is pretty excruciating. I thought it may be gas I do not have a fever and I'm not vomiting have gone #2 now pain going to right testi - Entered by patient   Testicle Pain    HPI Eddie Hart is a 38 y.o. male.   Patient presenting today with multiple concerns.  States he started 4 days ago with epigastric and right upper quadrant pain that was initially severe but is now a 2-3 out of 10 during the day and a 9 out of 10 overnight.  He states he initially thought he was constipated so he took 2 doses of MiraLAX and had 3 bowel movements but did not feel any relief.  Then took some Tums and 2 Gas-X and had enough relief to get some sleep with this.  Denies associated fevers, nausea, vomiting, melena, hematemesis, new foods or medications, urinary symptoms.  He then started to have right testicular pain and now scrotal edema for the past day.  Denies discoloration, rashes or lesions, penile discharge, dysuria, hematuria, pelvic pain, fevers.  So far not tried anything for the symptoms.  Does note a history of multiple inguinal hernias status post repair to the left, has never had an issue on the right previously.    Past Medical History:  Diagnosis Date   Asthma    GERD (gastroesophageal reflux disease)     There are no active problems to display for this patient.   Past Surgical History:  Procedure Laterality Date   HERNIA REPAIR         Home Medications    Prior to Admission medications   Medication Sig Start Date End Date Taking? Authorizing Provider  acetaminophen  (TYLENOL ) 500 MG tablet Take 1,500 mg by mouth every 6 (six) hours as needed for mild pain, fever or headache.   Yes  [provider]  cetirizine (ZYRTEC) 10 MG tablet Take 10 mg by mouth daily.   Yes [provider]  magnesium citrate SOLN Take 1 Bottle by mouth once.   Yes [provider]  omeprazole (PRILOSEC) 20 MG capsule Take 20 mg by mouth in the morning and at bedtime.   Yes [provider]  pantoprazole  (PROTONIX ) 40 MG tablet Take 1 tablet (40 mg total) by mouth 2 (two) times daily. 10/05/23  Yes Stuart Vernell Norris, PA-C  sucralfate  (CARAFATE ) 1 g tablet Take 1 tablet (1 g total) by mouth 3 (three) times daily as needed. May dissolve 1 tablet into a glass of water and drink up to 3 times daily prior to meals 10/05/23  Yes Stuart Vernell Norris, PA-C  zinc gluconate 50 MG tablet Take 50 mg by mouth daily.   Yes [provider]  albuterol (VENTOLIN HFA) 108 (90 Base) MCG/ACT inhaler Inhale 1-2 puffs into the lungs every 6 (six) hours as needed for wheezing or shortness of breath.    [provider]  amoxicillin -clavulanate (AUGMENTIN ) 875-125 MG tablet Take 1 tablet by mouth every 12 (twelve) hours. Patient not taking: Reported on 05/13/2019 05/04/19   Avegno, Komlanvi S, FNP  cholecalciferol (VITAMIN D3) 25 MCG (1000 UNIT) tablet Take 5,000 Units by mouth daily.    [provider]  fluticasone  (FLONASE ) 50 MCG/ACT nasal spray Place 2 sprays into both nostrils daily. 06/16/21   Leath-Warren, Etta PARAS, NP  HYDROcodone -acetaminophen  (NORCO) 5-325 MG tablet Take 1 tablet by mouth every 4 (four) hours as needed for moderate pain. Patient not taking: Reported on 05/13/2019 05/11/19   Sofia, Leslie K, PA-C  HYDROcodone -acetaminophen  (NORCO/VICODIN) 5-325 MG tablet Take 1 tablet by mouth every 4 (four) hours as needed. 05/11/19   Sofia, Leslie K, PA-C  naproxen  (NAPROSYN ) 500 MG tablet Take 1 tablet (500 mg total) by mouth 2 (two) times daily with a meal. 05/07/19   Triplett, Tammy, PA-C  predniSONE  (DELTASONE ) 10 MG tablet Take 6 tablets day one, 5  tablets day two, 4 tablets day three, 3 tablets day four, 2 tablets day five, then 1 tablet day six Patient not taking: Reported on 05/13/2019 05/07/19   Herlinda Milling, PA-C  pseudoephedrine (SUDAFED) 120 MG 12 hr tablet Take 120 mg by mouth every 12 (twelve) hours as needed for congestion.    [provider]    Family History History reviewed. No pertinent family history.  Social History Social History   Tobacco Use   Smoking status: Some Days    Types: Cigarettes   Smokeless tobacco: Never  Substance Use Topics   Alcohol use: Yes   Drug use: Not Currently     Allergies   Patient has no known allergies.   Review of Systems Review of Systems Per HPI  Physical Exam Triage Vital Signs ED Triage Vitals  Encounter Vitals Group     BP 10/05/23 1723 127/86     Girls Systolic BP Percentile --      Girls Diastolic BP Percentile --      Boys Systolic BP Percentile --      Boys Diastolic BP Percentile --      Pulse Rate 10/05/23 1723 91     Resp 10/05/23 1723 16     Temp 10/05/23 1723 99.6 F (37.6 C)     Temp Source 10/05/23 1723 Oral     SpO2 10/05/23 1723 98 %     Weight --      Height --      Head Circumference --      Peak Flow --      Pain Score 10/05/23 1724 3     Pain Loc --      Pain Education --      Exclude from Growth Chart --    No data found.  Updated Vital Signs BP 127/86 (BP Location: Right Arm)   Pulse 91   Temp 99.6 F (37.6 C) (Oral)   Resp 16   SpO2 98%   Visual Acuity Right Eye Distance:   Left Eye Distance:   Bilateral Distance:    Right Eye Near:   Left Eye Near:    Bilateral Near:     Physical Exam Vitals and nursing note reviewed. Exam conducted with a chaperone present.  Constitutional:      Appearance: Normal appearance.  HENT:     Head: Atraumatic.  Eyes:     Extraocular Movements: Extraocular movements intact.     Conjunctiva/sclera: Conjunctivae normal.  Cardiovascular:     Rate and Rhythm: Normal rate and  regular rhythm.  Pulmonary:     Effort: Pulmonary effort is normal.     Breath sounds: Normal breath sounds.  Abdominal:     General: Bowel sounds are normal. There is no distension.     Palpations: Abdomen  is soft.     Tenderness: There is abdominal tenderness. There is guarding. There is no right CVA tenderness or left CVA tenderness.     Comments: Slight guarding to the epigastric region with tenderness to palpation.  Much milder tenderness to palpation to the right upper quadrant, negative Murphy sign, negative rebound tenderness.  Bowel sounds equal throughout  Genitourinary:    Comments: Firm and tenderness to palpation at the right inguinal canal, diffuse scrotal edema, testicular tenderness and enlargement.  Possible palpable nodule to the medial aspect of the right testicle but difficult to discern for sure due to extent of swelling.  No discoloration to the area, rashes, lesions Musculoskeletal:        General: Normal range of motion.     Cervical back: Normal range of motion and neck supple.  Skin:    General: Skin is warm and dry.  Neurological:     General: No focal deficit present.     Mental Status: He is oriented to person, place, and time.  Psychiatric:        Mood and Affect: Mood normal.        Thought Content: Thought content normal.        Judgment: Judgment normal.      UC Treatments / Results  Labs (all labs ordered are listed, but only abnormal results are displayed) Labs Reviewed  POCT URINE DIPSTICK    EKG   Radiology No results found.  Procedures Procedures (including critical care time)  Medications Ordered in UC Medications  alum & mag hydroxide-simeth (MAALOX/MYLANTA) 200-200-20 MG/5ML suspension 30 mL (30 mLs Oral Given 10/05/23 1823)  lidocaine  (XYLOCAINE ) 2 % viscous mouth solution 15 mL (15 mLs Mouth/Throat Given 10/05/23 1823)    Initial Impression / Assessment and Plan / UC Course  I have reviewed the triage vital signs and the  nursing notes.  Pertinent labs & imaging results that were available during my care of the patient were reviewed by me and considered in my medical decision making (see chart for details).     Vital signs within normal limits today, he is well-appearing and in no acute distress.  He is tolerating p.o. without difficulty.  Unclear etiology of both concerns, however suspect gastritis to be causing his upper abdominal symptoms.  Trial GI cocktail, Protonix , Carafate , dietary modifications and follow-up for worsening or unresolving symptoms.  Regarding his testicular and scrotal symptoms, low suspicion for relation between the 2 symptoms but cannot rule this out.  Discussed differential possibly inguinal hernia versus varicocele versus testicular etiology.  Very low suspicion for testicular torsion.  Urinalysis within normal limits and no risk factors for STI.  Close urology follow-up recommended with resources given for this.  Also recommended getting a primary care provider as soon as possible.  Supportive home care and ED precautions reviewed.  Final Clinical Impressions(s) / UC Diagnoses   Final diagnoses:  Abdominal pain, epigastric  Scrotal edema  Testicular pain, right     Discharge Instructions      I am not convinced that your upper abdominal pain and your testicular/scrotal symptoms are related, however I cannot rule this out at this point.  For your upper abdominal pain which I suspect to be more of a gastritis type issue, I have prescribed a stronger acid reducing medication to be given twice a day for the next week to 10 days as well as a medication to help coat your esophagus and stomach lining prior to meals.  Regarding  your scrotal symptoms, avoid heavy lifting or trauma to the area, warm Epsom salt soaks and warm compresses, Tylenol  as needed and call urology tomorrow to see when the soonest they can see you is.  If either of these issues gets progressively worse go to the emergency  department for immediate further evaluation.  As discussed, if you are interested in finding a primary care provider which I highly recommend you may go to the [facility] health website and click on the find a provider tab.  From here you can locate providers accepting new patients and even schedule a new patient appointment.    ED Prescriptions     Medication Sig Dispense Auth. Provider   pantoprazole  (PROTONIX ) 40 MG tablet Take 1 tablet (40 mg total) by mouth 2 (two) times daily. 20 tablet Stuart Vernell Norris, PA-C   sucralfate  (CARAFATE ) 1 g tablet Take 1 tablet (1 g total) by mouth 3 (three) times daily as needed. May dissolve 1 tablet into a glass of water and drink up to 3 times daily prior to meals 30 tablet Stuart Vernell Norris, [place]      PDMP not reviewed this encounter.   Stuart Vernell Norris, [place] 10/05/23 [phone]

## 2023-10-05 NOTE — ED Triage Notes (Signed)
 Pt states he is having upper epigastric pain that started Friday. Saturday pt says the pain got worse, tried taking a stool softener, gas x and tums. Pt says he is now having right lower abdominal pain that goes to right testicle and having swelling on right testicle that started today. Taking tylenol .

## 2023-10-05 NOTE — Telephone Encounter (Signed)
 Pt called stating that his insurance does not cover his prescriptions going to [facility] and will need his prescriptions sent to [facility] on [address].

## 2023-10-05 NOTE — Discharge Instructions (Signed)
 I am not convinced that your upper abdominal pain and your testicular/scrotal symptoms are related, however I cannot rule this out at this point.  For your upper abdominal pain which I suspect to be more of a gastritis type issue, I have prescribed a stronger acid reducing medication to be given twice a day for the next week to 10 days as well as a medication to help coat your esophagus and stomach lining prior to meals.  Regarding your scrotal symptoms, avoid heavy lifting or trauma to the area, warm Epsom salt soaks and warm compresses, Tylenol  as needed and call urology tomorrow to see when the soonest they can see you is.  If either of these issues gets progressively worse go to the emergency department for immediate further evaluation.  As discussed, if you are interested in finding a primary care provider which I highly recommend you may go to the [facility] health website and click on the find a provider tab.  From here you can locate providers accepting new patients and even schedule a new patient appointment.

## 2023-10-06 ENCOUNTER — Telehealth: Payer: Self-pay

## 2023-10-06 NOTE — Telephone Encounter (Signed)
 Patient was transferred by the front office and left a message to schedule a new pt appt. I called I'm back and made him aware that [facility] was booked out about 1 month but [facility] could see him tomorrow 10/07/23 at 9:15. Patient declined and advised he would go to the hospital. He advised he was just going to see how he felt and he would call back. I offered to schedule the [facility] appt for and he could call back to cancel if needed. Patient declined.

## 2024-03-04 ENCOUNTER — Ambulatory Visit: Payer: Self-pay

## 2024-03-04 ENCOUNTER — Ambulatory Visit
Admission: RE | Admit: 2024-03-04 | Discharge: 2024-03-04 | Disposition: A | Discharge status: Home / Self Care | Source: Ambulatory Visit | Attending: Nurse Practitioner | Admitting: Nurse Practitioner

## 2024-03-04 VITALS — BP 130/87 | HR 93 | Temp 98.3°F | Resp 18

## 2024-03-04 DIAGNOSIS — B9789 Other viral agents as the cause of diseases classified elsewhere: Secondary | ICD-10-CM | POA: Insufficient documentation

## 2024-03-04 DIAGNOSIS — J029 Acute pharyngitis, unspecified: Secondary | ICD-10-CM | POA: Diagnosis present

## 2024-03-04 DIAGNOSIS — J069 Acute upper respiratory infection, unspecified: Secondary | ICD-10-CM | POA: Insufficient documentation

## 2024-03-04 LAB — POCT RAPID STREP A (OFFICE): Rapid Strep A Screen: NEGATIVE

## 2024-03-04 MED ORDER — PROMETHAZINE-DM 6.25-15 MG/5ML PO SYRP: 5.0000 mL | ORAL_SOLUTION | Freq: Four times a day (QID) | ORAL | 0 refills | Status: AC | PRN

## 2024-03-04 MED ORDER — LIDOCAINE VISCOUS HCL 2 % MT SOLN: OROMUCOSAL | 0 refills | Status: AC

## 2024-03-04 NOTE — ED Triage Notes (Signed)
 Pt reports he has been having a cough , sore throat, chest congestion x 1 week    Took two tylenols

## 2024-03-04 NOTE — Discharge Instructions (Signed)
 The rapid strep test was negative.  A throat culture has been ordered.  You will be contacted if the pending test result is abnormal.  You will also have access to the results via MyChart. Take medication as prescribed. Increase fluids and allow for plenty of rest. You may continue over-the-counter Tylenol  or ibuprofen as needed for pain, fever, or general discomfort. Recommend warm salt water gargles 3-4 times daily as needed for throat pain or discomfort. For the cough, recommend use of a humidifier in your bedroom at nighttime during sleep and to sleep elevated on pillows while symptoms persist. If symptoms fail to improve over the next 5 to 7 days, or appear to worsen, you may follow-up in this clinic or with your primary care physician for further evaluation. Follow-up as needed.

## 2024-03-04 NOTE — ED Provider Notes (Addendum)
 " [facility]    CSN: [id number] Arrival date & time: 03/04/24  1356      History   Chief Complaint Chief Complaint  Patient presents with   Sore Throat    Had a cough for a while almost gone but now I cant talk and my throat is killing me - Entered by patient    HPI Eddie Hart is a 39 y.o. male.   The history is provided by the patient.   Patient presents for complaints of cough and sore throat.  Patient states cough is been present for the past several days, with the sore throat starting over the past 24 hours.  He states the pain was severe last evening.  He denies any obvious close sick contacts.  States prior to his symptoms starting, he did go outside to smoke and cold degree temperatures.  Patient denies fever, chills, headache, ear pain, wheezing, difficulty breathing, abdominal pain, nausea, vomiting, diarrhea, or rash.  He denies any obvious close sick contacts.  States he has taken both Tylenol  and ibuprofen for his symptoms.  Patient reports history of recurrent strep as a child.  Past Medical History:  Diagnosis Date   Asthma    GERD (gastroesophageal reflux disease)     There are no active problems to display for this patient.   Past Surgical History:  Procedure Laterality Date   HERNIA REPAIR         Home Medications    Prior to Admission medications  Medication Sig Start Date End Date Taking? Authorizing Provider  lidocaine  (XYLOCAINE ) 2 % solution Gargle and spit 5mL every 6 hours as needed for throat pain or discomfort. 03/04/24  Yes Leath-Warren, Etta PARAS, NP  loratadine-pseudoephedrine (CLARITIN-D 12-HOUR) 5-120 MG tablet Take 1 tablet by mouth 2 (two) times daily.   Yes [provider]  magnesium gluconate (MAGONATE) 500 (27 Mg) MG TABS tablet Take 500 mg by mouth in the morning and at bedtime.   Yes [provider]  promethazine -dextromethorphan (PROMETHAZINE -DM) 6.25-15 MG/5ML syrup Take 5 mLs by mouth 4  (four) times daily as needed. 03/04/24  Yes Leath-Warren, Etta PARAS, NP  acetaminophen  (TYLENOL ) 500 MG tablet Take 1,500 mg by mouth every 6 (six) hours as needed for mild pain, fever or headache.    [provider]  albuterol (VENTOLIN HFA) 108 (90 Base) MCG/ACT inhaler Inhale 1-2 puffs into the lungs every 6 (six) hours as needed for wheezing or shortness of breath.    [provider]  amoxicillin -clavulanate (AUGMENTIN ) 875-125 MG tablet Take 1 tablet by mouth every 12 (twelve) hours. Patient not taking: Reported on 05/13/2019 05/04/19   Avegno, Komlanvi S, FNP  cetirizine (ZYRTEC) 10 MG tablet Take 10 mg by mouth daily.    [provider]  cholecalciferol (VITAMIN D3) 25 MCG (1000 UNIT) tablet Take 5,000 Units by mouth daily.    [provider]  fluticasone  (FLONASE ) 50 MCG/ACT nasal spray Place 2 sprays into both nostrils daily. 06/16/21   Leath-Warren, Etta PARAS, NP  HYDROcodone -acetaminophen  (NORCO) 5-325 MG tablet Take 1 tablet by mouth every 4 (four) hours as needed for moderate pain. Patient not taking: Reported on 05/13/2019 05/11/19   Sofia, Leslie K, PA-C  HYDROcodone -acetaminophen  (NORCO/VICODIN) 5-325 MG tablet Take 1 tablet by mouth every 4 (four) hours as needed. 05/11/19   Sofia, Leslie K, PA-C  magnesium citrate SOLN Take 1 Bottle by mouth once.    [provider]  naproxen  (NAPROSYN ) 500 MG tablet Take 1  tablet (500 mg total) by mouth 2 (two) times daily with a meal. 05/07/19   Triplett, Tammy, PA-C  omeprazole (PRILOSEC) 20 MG capsule Take 20 mg by mouth in the morning and at bedtime.    [provider]  pantoprazole  (PROTONIX ) 40 MG tablet Take 1 tablet (40 mg total) by mouth 2 (two) times daily. 10/05/23   Stuart Vernell Norris, PA-C  predniSONE  (DELTASONE ) 10 MG tablet Take 6 tablets day one, 5 tablets day two, 4 tablets day three, 3 tablets day four, 2 tablets day five, then 1 tablet day six Patient not taking: Reported on  05/13/2019 05/07/19   Herlinda Milling, PA-C  pseudoephedrine (SUDAFED) 120 MG 12 hr tablet Take 120 mg by mouth every 12 (twelve) hours as needed for congestion.    [provider]  sucralfate  (CARAFATE ) 1 g tablet Take 1 tablet (1 g total) by mouth 3 (three) times daily as needed. May dissolve 1 tablet into a glass of water and drink up to 3 times daily prior to meals 10/05/23   Stuart Vernell Norris, PA-C  zinc gluconate 50 MG tablet Take 50 mg by mouth daily.    [provider]    Family History History reviewed. No pertinent family history.  Social History Social History[1]   Allergies   Patient has no known allergies.   Review of Systems Review of Systems Per HPI  Physical Exam Triage Vital Signs ED Triage Vitals  Encounter Vitals Group     BP 03/04/24 1514 130/87     Girls Systolic BP Percentile --      Girls Diastolic BP Percentile --      Boys Systolic BP Percentile --      Boys Diastolic BP Percentile --      Pulse Rate 03/04/24 1514 93     Resp 03/04/24 1514 18     Temp 03/04/24 1514 98.3 F (36.8 C)     Temp Source 03/04/24 1514 Oral     SpO2 03/04/24 1514 95 %     Weight --      Height --      Head Circumference --      Peak Flow --      Pain Score 03/04/24 1510 5     Pain Loc --      Pain Education --      Exclude from Growth Chart --    No data found.  Updated Vital Signs BP 130/87 (BP Location: Right Arm)   Pulse 93   Temp 98.3 F (36.8 C) (Oral)   Resp 18   SpO2 95%   Visual Acuity Right Eye Distance:   Left Eye Distance:   Bilateral Distance:    Right Eye Near:   Left Eye Near:    Bilateral Near:     Physical Exam Vitals and nursing note reviewed.  Constitutional:      General: He is not in acute distress.    Appearance: Normal appearance.  HENT:     Head: Normocephalic.     Right Ear: Tympanic membrane, ear canal and external ear normal.     Left Ear: Tympanic membrane, ear canal and external ear normal.      Nose: Congestion present.     Mouth/Throat:     Lips: Pink.     Mouth: Mucous membranes are moist.     Pharynx: Uvula midline. Pharyngeal swelling, posterior oropharyngeal erythema and postnasal drip present. No oropharyngeal exudate or uvula swelling.     Tonsils:  No tonsillar exudate. 1+ on the right. 1+ on the left.     Comments: Cobblestoning present to posterior oropharynx  Eyes:     Extraocular Movements: Extraocular movements intact.     Pupils: Pupils are equal, round, and reactive to light.  Cardiovascular:     Rate and Rhythm: Normal rate and regular rhythm.     Pulses: Normal pulses.     Heart sounds: Normal heart sounds.  Pulmonary:     Effort: Pulmonary effort is normal. No respiratory distress.     Breath sounds: Normal breath sounds. No stridor. No wheezing, rhonchi or rales.  Musculoskeletal:     Cervical back: Normal range of motion.  Lymphadenopathy:     Cervical: No cervical adenopathy.  Skin:    General: Skin is warm and dry.  Neurological:     General: No focal deficit present.     Mental Status: He is alert and oriented to person, place, and time.  Psychiatric:        Mood and Affect: Mood normal.        Behavior: Behavior normal.      UC Treatments / Results  Labs (all labs ordered are listed, but only abnormal results are displayed) Labs Reviewed  CULTURE, GROUP A STREP [facility])  POCT RAPID STREP A (OFFICE)    EKG   Radiology No results found.  Procedures Procedures (including critical care time)  Medications Ordered in UC Medications - No data to display  Initial Impression / Assessment and Plan / UC Course  I have reviewed the triage vital signs and the nursing notes.  Pertinent labs & imaging results that were available during my care of the patient were reviewed by me and considered in my medical decision making (see chart for details).  The rapid strep test was negative.  A throat culture is pending.  Patient also with underlying  cough.  Symptoms consistent with viral etiology pending the results of the throat culture.  In the interim, we will provide symptomatic treatment with Promethazine  DM for the cough and viscous lidocaine  2% for the patient's throat pain.  Supportive care recommendations were provided and discussed with the patient to include fluids, rest, continuing over-the-counter analgesics, warm salt water gargles, and use of Chloraseptic throat spray or throat lozenges.  Discussed indications with the patient regarding follow-up.  Patient was in agreement with this plan of care and verbalizes understanding.  All questions were answered.  Patient stable for discharge.  Work note was provided.  Final Clinical Impressions(s) / UC Diagnoses   Final diagnoses:  Sore throat  Viral URI with cough     Discharge Instructions      The rapid strep test was negative.  A throat culture has been ordered.  You will be contacted if the pending test result is abnormal.  You will also have access to the results via MyChart. Take medication as prescribed. Increase fluids and allow for plenty of rest. You may continue over-the-counter Tylenol  or ibuprofen as needed for pain, fever, or general discomfort. Recommend warm salt water gargles 3-4 times daily as needed for throat pain or discomfort. For the cough, recommend use of a humidifier in your bedroom at nighttime during sleep and to sleep elevated on pillows while symptoms persist. If symptoms fail to improve over the next 5 to 7 days, or appear to worsen, you may follow-up in this clinic or with your primary care physician for further evaluation. Follow-up as needed.     ED  Prescriptions     Medication Sig Dispense Auth. Provider   lidocaine  (XYLOCAINE ) 2 % solution Gargle and spit 5mL every 6 hours as needed for throat pain or discomfort. 100 mL Leath-Warren, Etta PARAS, NP   promethazine -dextromethorphan (PROMETHAZINE -DM) 6.25-15 MG/5ML syrup Take 5 mLs by mouth 4  (four) times daily as needed. 118 mL Leath-Warren, Etta PARAS, NP      PDMP not reviewed this encounter.    Gilmer Etta PARAS, NP 03/04/24 1537     [1]  Social History Tobacco Use   Smoking status: Some Days    Types: Cigarettes   Smokeless tobacco: Never  Substance Use Topics   Alcohol use: Yes   Drug use: Not Currently     Gilmer Etta PARAS, NP 03/04/24 1538  "

## 2024-03-06 LAB — CULTURE, GROUP A STREP (THRC)

## 2024-03-07 ENCOUNTER — Ambulatory Visit (HOSPITAL_COMMUNITY): Payer: Self-pay

## 2024-03-07 MED ORDER — AMOXICILLIN 500 MG PO CAPS: 500.0000 mg | ORAL_CAPSULE | Freq: Two times a day (BID) | ORAL | 0 refills | Status: AC
